# Patient Record
Sex: Female | Born: 1975 | Race: Black or African American | Hispanic: No | Marital: Single | Smoking: Never smoker
Health system: Southern US, Community
[De-identification: ages and names within clinical notes are randomized; demographics above are authoritative.]

## PROBLEM LIST (undated history)

## (undated) DIAGNOSIS — B20 Human immunodeficiency virus [HIV] disease: Secondary | ICD-10-CM

## (undated) DIAGNOSIS — A6000 Herpesviral infection of urogenital system, unspecified: Secondary | ICD-10-CM

## (undated) DIAGNOSIS — T7421XA Adult sexual abuse, confirmed, initial encounter: Secondary | ICD-10-CM

## (undated) DIAGNOSIS — R8789 Other abnormal findings in specimens from female genital organs: Secondary | ICD-10-CM

## (undated) DIAGNOSIS — C469 Kaposi's sarcoma, unspecified: Secondary | ICD-10-CM

## (undated) DIAGNOSIS — R87618 Other abnormal cytological findings on specimens from cervix uteri: Secondary | ICD-10-CM

## (undated) HISTORY — PX: LESION EXCISION: SHX5167

---

## 2004-05-29 HISTORY — PX: CHOLECYSTECTOMY, LAPAROSCOPIC: SHX56

## 2005-01-17 ENCOUNTER — Emergency Department (HOSPITAL_COMMUNITY): Admission: EM | Admit: 2005-01-17 | Discharge: 2005-01-17 | Payer: Self-pay | Admitting: Emergency Medicine

## 2012-08-31 ENCOUNTER — Encounter (HOSPITAL_BASED_OUTPATIENT_CLINIC_OR_DEPARTMENT_OTHER): Payer: Self-pay | Admitting: *Deleted

## 2012-08-31 ENCOUNTER — Emergency Department (HOSPITAL_BASED_OUTPATIENT_CLINIC_OR_DEPARTMENT_OTHER)
Admission: EM | Admit: 2012-08-31 | Discharge: 2012-09-01 | Disposition: A | Discharge status: Home / Self Care | Payer: 59 | Attending: Emergency Medicine | Admitting: Emergency Medicine

## 2012-08-31 DIAGNOSIS — W261XXA Contact with sword or dagger, initial encounter: Secondary | ICD-10-CM | POA: Insufficient documentation

## 2012-08-31 DIAGNOSIS — Y9389 Activity, other specified: Secondary | ICD-10-CM | POA: Insufficient documentation

## 2012-08-31 DIAGNOSIS — Z79899 Other long term (current) drug therapy: Secondary | ICD-10-CM | POA: Insufficient documentation

## 2012-08-31 DIAGNOSIS — S61409A Unspecified open wound of unspecified hand, initial encounter: Secondary | ICD-10-CM | POA: Insufficient documentation

## 2012-08-31 DIAGNOSIS — S61412A Laceration without foreign body of left hand, initial encounter: Secondary | ICD-10-CM

## 2012-08-31 DIAGNOSIS — Y929 Unspecified place or not applicable: Secondary | ICD-10-CM | POA: Insufficient documentation

## 2012-08-31 DIAGNOSIS — W260XXA Contact with knife, initial encounter: Secondary | ICD-10-CM | POA: Insufficient documentation

## 2012-08-31 NOTE — ED Notes (Signed)
Pt states she was trying to open a can and cut herself with a knife. Approx 1 cm lac to left hand. Bleeding controlled.

## 2012-09-01 NOTE — ED Notes (Signed)
MD at bedside. 

## 2012-09-01 NOTE — ED Provider Notes (Signed)
History    This chart was scribed for Gwyneth Sprout, MD scribed by Magnus Sinning. The patient was seen in room MH09/MH09 at 00:07   CSN: [id number]  Arrival date & time 08/31/12  2332     Chief Complaint  Patient presents with  . Laceration    (Consider location/radiation/quality/duration/timing/severity/associated sxs/prior treatment) Patient is a 37 y.o. female presenting with skin laceration. The history is provided by the patient. No language interpreter was used.  Laceration  Brandy Mendez is a 37 y.o. female who presents to the Emergency Department complaining of a 1 cm laceration the dorsum of the left hand near the base of the thumb after accidentally cutting herself with a knife when trying to open a can tonight. She initially reports numbness to the thumb, but on exam reports normal sensation. History reviewed. No pertinent past medical history.  History reviewed. No pertinent past surgical history.  History reviewed. No pertinent family history.  History  Substance Use Topics  . Smoking status: Never Smoker   . Smokeless tobacco: Not on file  . Alcohol Use: No   Review of Systems  Skin:       Laceration to the left hand    Allergies  Penicillins  Home Medications   Current Outpatient Rx  Name  Route  Sig  Dispense  Refill  . Sulfamethoxazole-Trimethoprim (BACTRIM PO)   Oral   Take by mouth.           BP 135/91  Pulse 88  Temp(Src) 99 F (37.2 C) (Oral)  Ht 5\' 5"  (1.651 m)  Wt 179 lb (81.194 kg)  BMI 29.79 kg/m2  SpO2 98%  LMP 08/10/2012  Physical Exam  Nursing note and vitals reviewed. Constitutional: She is oriented to person, place, and time. She appears well-developed and well-nourished. No distress.  HENT:  Head: Normocephalic and atraumatic.  Eyes: EOM are normal. Pupils are equal, round, and reactive to light.  Neck: Neck supple. No tracheal deviation present.  Cardiovascular: Normal rate.   Pulmonary/Chest: Effort normal. No  respiratory distress.  Abdominal: Soft. She exhibits no distension.  Musculoskeletal: Normal range of motion. She exhibits no edema.       Hands: 1 cm laceration to the dorsal part of the hand near the thumb  Neurological: She is alert and oriented to person, place, and time. No sensory deficit.  Nml sensation and function of the left index finger. Can bend at the PIP and DIP joint.  Skin: Skin is warm and dry.  Psychiatric: She has a normal mood and affect. Her behavior is normal.    ED Course  Procedures (including critical care time) DIAGNOSTIC STUDIES: Oxygen Saturation is 98% on room air, normal by my interpretation.    COORDINATION OF CARE: 00:08: Physical exam performed.   LACERATION REPAIR PROCEDURE NOTE The patient's identification was confirmed and consent was obtained. This procedure was performed by Gwyneth Sprout, MD at 12:11 AM. Site: Dorsum of the left hand near thumb  Sterile procedures observed: Yes Anesthetic used (type and amt): 2% Lidocaine w/o epi Suture type/size:4-O prolene suture Length: 1 cm  # of Sutures:Two Technique:Simple Complexity: Interrupted Antibx ointment applied: Bacitracin Tetanus UTD: Last year Site anesthetized, irrigated with NS, explored without evidence of foreign body, wound well approximated, site covered with dry, sterile dressing.  Patient tolerated procedure well without complications. Instructions for care discussed verbally and patient provided with additional written instructions for homecare and f/u.  Labs Reviewed - No data to display No results found.  1. Hand laceration, left, initial encounter       MDM   Patient cut her hand with a knife tonight. She is neurovascularly intact without any sign of tendon injury.  And repaired as above in tetanus shot is up-to-date I personally performed the services described in this documentation, which was scribed in my presence.  The recorded information has been reviewed  and considered.        Gwyneth Sprout, MD 09/01/12 0157

## 2012-09-09 ENCOUNTER — Emergency Department (HOSPITAL_BASED_OUTPATIENT_CLINIC_OR_DEPARTMENT_OTHER)
Admission: EM | Admit: 2012-09-09 | Discharge: 2012-09-09 | Disposition: A | Discharge status: Home / Self Care | Payer: 59 | Attending: Emergency Medicine | Admitting: Emergency Medicine

## 2012-09-09 ENCOUNTER — Encounter (HOSPITAL_BASED_OUTPATIENT_CLINIC_OR_DEPARTMENT_OTHER): Payer: Self-pay | Admitting: *Deleted

## 2012-09-09 DIAGNOSIS — Z4802 Encounter for removal of sutures: Secondary | ICD-10-CM | POA: Insufficient documentation

## 2012-09-09 NOTE — ED Provider Notes (Signed)
History     CSN: [id number]  Arrival date & time 09/09/12  1257   First MD Initiated Contact with Patient 09/09/12 1323      Chief Complaint  Patient presents with  . Suture / Staple Removal    (Consider location/radiation/quality/duration/timing/severity/associated sxs/prior treatment) HPI Comments: 37 year old female presents emergency department for suture removal from her left hand. Patient states sutures were placed 9 days ago. Denies having any problems with the sutures since there were placed. Denies pain, redness, swelling or discharge. No fever or chills.  Patient is a 37 y.o. female presenting with suture removal. The history is provided by the patient.  Suture / Staple Removal     History reviewed. No pertinent past medical history.  History reviewed. No pertinent past surgical history.  No family history on file.  History  Substance Use Topics  . Smoking status: Never Smoker   . Smokeless tobacco: Not on file  . Alcohol Use: No    OB History   Grav Para Term Preterm Abortions TAB SAB Ect Mult Living                  Review of Systems  Skin: Negative for color change.  All other systems reviewed and are negative.    Allergies  Penicillins  Home Medications   Current Outpatient Rx  Name  Route  Sig  Dispense  Refill  . Sulfamethoxazole-Trimethoprim (BACTRIM PO)   Oral   Take by mouth.           BP 120/79  Pulse 87  Temp(Src) 98.1 F (36.7 C) (Oral)  Resp 20  Wt 179 lb (81.194 kg)  BMI 29.79 kg/m2  SpO2 100%  LMP 08/10/2012  Physical Exam  Nursing note and vitals reviewed. Constitutional: She is oriented to person, place, and time. She appears well-developed and well-nourished. No distress.  HENT:  Head: Normocephalic and atraumatic.  Mouth/Throat: Oropharynx is clear and moist.  Eyes: Conjunctivae are normal.  Neck: Normal range of motion.  Cardiovascular: Normal rate, regular rhythm and normal heart sounds.   Pulmonary/Chest:  Effort normal and breath sounds normal.  Musculoskeletal: Normal range of motion. She exhibits no edema.  Neurological: She is alert and oriented to person, place, and time.  Skin: Skin is warm and dry.  Well-healed 1 cm laceration to the dorsal aspect of her left hand near her thumb. No surrounding erythema, edema or discharge. 2 Sutures in place.  Psychiatric: She has a normal mood and affect. Her behavior is normal.    ED Course  Procedures (including critical care time) SUTURE REMOVAL Performed by: Johnnette Gourd  Consent: Verbal consent obtained. Patient identity confirmed: provided demographic data Time out: Immediately prior to procedure a "time out" was called to verify the correct patient, procedure, equipment, support staff and site/side marked as required.  Location details: left hand  Wound Appearance: clean  Sutures/Staples Removed: 2  Facility: sutures placed in this facility Patient tolerance: Patient tolerated the procedure well with no immediate complications.    Labs Reviewed - No data to display No results found.   1. Visit for suture removal       MDM  2 sutures removed without difficulty. Wound healed without any evidence of secondary infection.       Trevor Mace, PA-C 09/09/12 [phone]

## 2012-09-09 NOTE — ED Notes (Signed)
Suture removal. Sutures x 2 left hand.

## 2012-09-09 NOTE — ED Notes (Signed)
Pt. Has band aid on suture removal area on L hand.  Pt. Reports no pain.

## 2012-09-11 NOTE — ED Provider Notes (Signed)
Medical screening examination/treatment/procedure(s) were performed by non-physician practitioner and as supervising physician I was immediately available for consultation/collaboration.  Kanye Depree T Perle Brickhouse, MD 09/11/12 0828 

## 2013-01-27 HISTORY — PX: LACERATION REPAIR: SHX5284

## 2014-04-20 ENCOUNTER — Encounter (HOSPITAL_COMMUNITY): Payer: Self-pay | Admitting: Emergency Medicine

## 2014-04-20 ENCOUNTER — Encounter (HOSPITAL_COMMUNITY)
Admission: EM | Disposition: A | Discharge status: Home / Self Care | Payer: Self-pay | Source: Home / Self Care | Attending: Internal Medicine

## 2014-04-20 ENCOUNTER — Emergency Department (HOSPITAL_COMMUNITY): Payer: 59

## 2014-04-20 ENCOUNTER — Inpatient Hospital Stay (HOSPITAL_COMMUNITY)
Admission: EM | Admit: 2014-04-20 | Discharge: 2014-04-23 | DRG: 377 | Disposition: A | Discharge status: Home / Self Care | Payer: 59 | Attending: Internal Medicine | Admitting: Internal Medicine

## 2014-04-20 ENCOUNTER — Inpatient Hospital Stay (HOSPITAL_COMMUNITY): Payer: 59

## 2014-04-20 DIAGNOSIS — Z79899 Other long term (current) drug therapy: Secondary | ICD-10-CM

## 2014-04-20 DIAGNOSIS — B20 Human immunodeficiency virus [HIV] disease: Secondary | ICD-10-CM | POA: Diagnosis present

## 2014-04-20 DIAGNOSIS — K920 Hematemesis: Secondary | ICD-10-CM | POA: Diagnosis present

## 2014-04-20 DIAGNOSIS — D62 Acute posthemorrhagic anemia: Secondary | ICD-10-CM | POA: Diagnosis present

## 2014-04-20 DIAGNOSIS — I951 Orthostatic hypotension: Secondary | ICD-10-CM | POA: Diagnosis present

## 2014-04-20 DIAGNOSIS — C469 Kaposi's sarcoma, unspecified: Secondary | ICD-10-CM | POA: Diagnosis present

## 2014-04-20 DIAGNOSIS — R42 Dizziness and giddiness: Secondary | ICD-10-CM | POA: Diagnosis present

## 2014-04-20 DIAGNOSIS — E876 Hypokalemia: Secondary | ICD-10-CM | POA: Diagnosis present

## 2014-04-20 DIAGNOSIS — K922 Gastrointestinal hemorrhage, unspecified: Secondary | ICD-10-CM | POA: Diagnosis present

## 2014-04-20 HISTORY — PX: ESOPHAGOGASTRODUODENOSCOPY: SHX5428

## 2014-04-20 HISTORY — DX: Herpesviral infection of urogenital system, unspecified: A60.00

## 2014-04-20 HISTORY — DX: Human immunodeficiency virus (HIV) disease: B20

## 2014-04-20 HISTORY — DX: Kaposi's sarcoma, unspecified: C46.9

## 2014-04-20 HISTORY — DX: Other abnormal findings in specimens from female genital organs: R87.89

## 2014-04-20 HISTORY — DX: Other abnormal cytological findings on specimens from cervix uteri: R87.618

## 2014-04-20 HISTORY — DX: Adult sexual abuse, confirmed, initial encounter: T74.21XA

## 2014-04-20 LAB — COMPREHENSIVE METABOLIC PANEL
ALBUMIN: 2.7 g/dL — AB (ref 3.5–5.2)
ALT: 17 U/L (ref 0–35)
AST: 14 U/L (ref 0–37)
Alkaline Phosphatase: 64 U/L (ref 39–117)
Anion gap: 8 (ref 5–15)
BILIRUBIN TOTAL: 0.3 mg/dL (ref 0.3–1.2)
BUN: 34 mg/dL — ABNORMAL HIGH (ref 6–23)
CHLORIDE: 107 meq/L (ref 96–112)
CO2: 23 mEq/L (ref 19–32)
CREATININE: 0.63 mg/dL (ref 0.50–1.10)
Calcium: 7.8 mg/dL — ABNORMAL LOW (ref 8.4–10.5)
GFR calc Af Amer: >90 mL/min (ref >90)
GFR calc non Af Amer: >90 mL/min (ref >90)
Glucose, Bld: 113 mg/dL — ABNORMAL HIGH (ref 70–99)
Potassium: 4.4 mEq/L (ref 3.7–5.3)
SODIUM: 138 meq/L (ref 137–147)
Total Protein: 5.4 g/dL — ABNORMAL LOW (ref 6.0–8.3)

## 2014-04-20 LAB — CBC WITH DIFFERENTIAL/PLATELET
Basophils Absolute: 0 10*3/uL (ref 0.0–0.1)
Basophils Relative: 1 % (ref 0–1)
Eosinophils Absolute: 0.3 10*3/uL (ref 0.0–0.7)
Eosinophils Relative: 4 % (ref 0–5)
HCT: 25.8 % — ABNORMAL LOW (ref 36.0–46.0)
HEMOGLOBIN: 8.3 g/dL — AB (ref 12.0–15.0)
LYMPHS ABS: 1.2 10*3/uL (ref 0.7–4.0)
Lymphocytes Relative: 14 % (ref 12–46)
MCH: 26.4 pg (ref 26.0–34.0)
MCHC: 32.2 g/dL (ref 30.0–36.0)
MCV: 82.2 fL (ref 78.0–100.0)
MONOS PCT: 9 % (ref 3–12)
Monocytes Absolute: 0.8 10*3/uL (ref 0.1–1.0)
NEUTROS ABS: 6.3 10*3/uL (ref 1.7–7.7)
NEUTROS PCT: 72 % (ref 43–77)
Platelets: 220 10*3/uL (ref 150–400)
RBC: 3.14 MIL/uL — AB (ref 3.87–5.11)
RDW: 12.6 % (ref 11.5–15.5)
WBC: 8.7 10*3/uL (ref 4.0–10.5)

## 2014-04-20 LAB — POC URINE PREG, ED: PREG TEST UR: NEGATIVE

## 2014-04-20 LAB — PROTIME-INR
INR: 1.19 (ref 0.00–1.49)
PROTHROMBIN TIME: 15.3 s — AB (ref 11.6–15.2)

## 2014-04-20 LAB — ABO/RH: ABO/RH(D): O POS

## 2014-04-20 LAB — PREPARE RBC (CROSSMATCH)

## 2014-04-20 LAB — POC OCCULT BLOOD, ED: Fecal Occult Bld: NEGATIVE

## 2014-04-20 SURGERY — EGD (ESOPHAGOGASTRODUODENOSCOPY)
Anesthesia: Moderate Sedation

## 2014-04-20 MED ORDER — MIDAZOLAM HCL 5 MG/5ML IJ SOLN
INTRAMUSCULAR | Status: DC | PRN
  Administered 2014-04-20 (×2): 2 mg via INTRAVENOUS
  Administered 2014-04-20: 1 mg via INTRAVENOUS

## 2014-04-20 MED ORDER — ONDANSETRON HCL 4 MG/2ML IJ SOLN
4.0000 mg | Freq: Once | INTRAMUSCULAR | Status: AC
  Administered 2014-04-20: 4 mg via INTRAVENOUS
  Filled 2014-04-20: qty 2

## 2014-04-20 MED ORDER — SODIUM CHLORIDE 0.9 % IV SOLN
8.0000 mg/h | INTRAVENOUS | Status: DC
  Administered 2014-04-20: 8 mg/h via INTRAVENOUS
  Filled 2014-04-20 (×4): qty 80

## 2014-04-20 MED ORDER — FENTANYL CITRATE 0.05 MG/ML IJ SOLN
INTRAMUSCULAR | Status: DC | PRN
  Administered 2014-04-20 (×3): 25 ug via INTRAVENOUS

## 2014-04-20 MED ORDER — IOHEXOL 300 MG/ML  SOLN
100.0000 mL | Freq: Once | INTRAMUSCULAR | Status: AC | PRN
  Administered 2014-04-20: 100 mL via INTRAVENOUS

## 2014-04-20 MED ORDER — ONDANSETRON HCL 4 MG/2ML IJ SOLN: 4.0000 mg | Freq: Four times a day (QID) | INTRAMUSCULAR | Status: DC | PRN

## 2014-04-20 MED ORDER — SODIUM CHLORIDE 0.9 % IV BOLUS (SEPSIS)
1000.0000 mL | Freq: Once | INTRAVENOUS | Status: AC
  Administered 2014-04-20: 1000 mL via INTRAVENOUS

## 2014-04-20 MED ORDER — PANTOPRAZOLE SODIUM 40 MG IV SOLR: 40.0000 mg | Freq: Two times a day (BID) | INTRAVENOUS | Status: DC

## 2014-04-20 MED ORDER — SODIUM CHLORIDE 0.9 % IV SOLN: Freq: Once | INTRAVENOUS | Status: DC

## 2014-04-20 MED ORDER — FENTANYL CITRATE 0.05 MG/ML IJ SOLN
INTRAMUSCULAR | Status: AC
  Filled 2014-04-20: qty 2

## 2014-04-20 MED ORDER — DIPHENHYDRAMINE HCL 50 MG/ML IJ SOLN
INTRAMUSCULAR | Status: AC
  Filled 2014-04-20: qty 1

## 2014-04-20 MED ORDER — ACETAMINOPHEN 650 MG RE SUPP: 650.0000 mg | Freq: Four times a day (QID) | RECTAL | Status: DC | PRN

## 2014-04-20 MED ORDER — MIDAZOLAM HCL 5 MG/ML IJ SOLN
INTRAMUSCULAR | Status: AC
  Filled 2014-04-20: qty 2

## 2014-04-20 MED ORDER — PANTOPRAZOLE SODIUM 40 MG IV SOLR
40.0000 mg | Freq: Once | INTRAVENOUS | Status: AC
  Administered 2014-04-20: 40 mg via INTRAVENOUS
  Filled 2014-04-20: qty 40

## 2014-04-20 MED ORDER — ACETAMINOPHEN 325 MG PO TABS
650.0000 mg | ORAL_TABLET | Freq: Four times a day (QID) | ORAL | Status: DC | PRN
  Administered 2014-04-20 – 2014-04-21 (×2): 650 mg via ORAL
  Filled 2014-04-20 (×2): qty 2

## 2014-04-20 MED ORDER — SODIUM CHLORIDE 0.9 % IV BOLUS (SEPSIS): 1000.0000 mL | Freq: Once | INTRAVENOUS | Status: DC

## 2014-04-20 MED ORDER — SODIUM CHLORIDE 0.9 % IV SOLN: INTRAVENOUS | Status: DC

## 2014-04-20 MED ORDER — SODIUM CHLORIDE 0.9 % IV SOLN
80.0000 mg | Freq: Once | INTRAVENOUS | Status: AC
  Administered 2014-04-20: 80 mg via INTRAVENOUS
  Filled 2014-04-20: qty 80

## 2014-04-20 NOTE — Plan of Care (Signed)
Problem: Consults Goal: Skin Care Protocol Initiated - if Braden Score 18 or less If consults are not indicated, leave blank or document N/A  Outcome: Not Applicable Date Met:  64/35/39 Goal: Diabetes Guidelines if Diabetic/Glucose > 140 If diabetic or lab glucose is > 140 mg/dl - Initiate Diabetes/Hyperglycemia Guidelines & Document Interventions  Outcome: Not Applicable Date Met:  05/22/82  Problem: Phase I Progression Outcomes Goal: OOB as tolerated unless otherwise ordered Outcome: Completed/Met Date Met:  04/20/14 Goal: Voiding-avoid urinary catheter unless indicated Outcome: Completed/Met Date Met:  04/20/14

## 2014-04-20 NOTE — Progress Notes (Signed)
Upper endoscopy was entirely normal.  This raises the question of a more distal GI bleeding source such as the small bowel where she could have a Kaposi's lesion.  Mallory-Weiss tear is also a consideration.  Recommendations #1 CT of the abdomen and pelvis #2 pending results of above would consider repeat endoscopy and possibly small bowel enteroscopy for a current overt GI bleeding

## 2014-04-20 NOTE — ED Notes (Signed)
Pt DOES NOT WANT DAUGHTERS TO KNOW ANY OF HER MEDICAL HX-- DO NOT DISCUSS ANY HX IN FRONT OF FAMILY

## 2014-04-20 NOTE — ED Notes (Addendum)
To ED via [facility] from home with c/o vomiting blood, when EMS arrived on scene-- pt was found in a large puddle of dark red blood with clots, pt states had a headache last night, with some vomiting and heart racing . Took Aleve. This morning had an episode of vomiting, dizziness, heart racing, faint feeling when standing up. Pt had a syncopal episode when EMS attempted to sit her up. BP initially was 60/p -- came up to 108/82.

## 2014-04-20 NOTE — H&P (Addendum)
[facility] History and Physical  Brandy Mendez ZDG:[id number] DOB: 02/01/1976 DOA: 04/20/2014  Referring physician: EDP PCP: No primary care provider on file.   Chief Complaint: Vomiting blood  HPI: Brandy Mendez is a 38 y.o. 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 This morning felt lightheaded and dizzy and then vomited bright red blood with clots 3 times and was brought to the ER by EMS. She denies ETOH, chronic NSAID use   Review of Systems: positives bolded Constitutional:  No weight loss, night sweats, Fevers, chills, fatigue.  HEENT:  No headaches, Difficulty swallowing,Tooth/dental problems,Sore throat,  No sneezing, itching, ear ache, nasal congestion, post nasal drip,  Cardio-vascular:  No chest pain, Orthopnea, PND, swelling in lower extremities, anasarca, dizziness, palpitations  GI:  No heartburn, indigestion, abdominal pain, nausea, vomiting, diarrhea, change in bowel habits, loss of appetite  Resp:  No shortness of breath with exertion or at rest. No excess mucus, no productive cough, No non-productive cough, No coughing up of blood.No change in color of mucus.No wheezing.No chest wall deformity  Skin:  no rash or lesions.  GU:  no dysuria, change in color of urine, no urgency or frequency. No flank pain.  Musculoskeletal:  No joint pain or swelling. No decreased range of motion. No back pain.  Psych:  No change in mood or affect. No depression or anxiety. No memory loss.   History reviewed. No pertinent past medical history. Past Surgical History  Procedure Laterality Date  . Laceration repair Left 01/2013    Hand  . Cholecystectomy, laparoscopic  2006  . Excision benign growth Right     Overlying the  scapular region   Social History:  reports that she has never smoked. She does not have any smokeless tobacco history on file. She reports that she does not drink alcohol or use illicit drugs.  Allergies  Allergen Reactions  . Penicillins Rash    No family history on file.   Prior to Admission medications   Not on File   Physical Exam: Filed Vitals:   04/20/14 1300 04/20/14 1315 04/20/14 1330 04/20/14 1345  BP: 111/59 113/58 119/66 127/66  Pulse: 93 101 105 109  Temp:      TempSrc:      Resp: 13 11 12 15   Height:      Weight:      SpO2: 100% 100% 100% 100%    Wt Readings from Last 3 Encounters:  04/20/14 86.183 kg (190 lb)  09/09/12 81.194 kg (179 lb)  08/31/12 81.194 kg (179 lb)    General:  Appears ill, AAOx3 Eyes: PERRL, normal lids, irises & conjunctiva ENT: grossly normal lips & tongue Neck: no LAD, masses or thyromegaly Cardiovascular: RRR, no m/r/g. No LE edema. Respiratory: CTA bilaterally, no w/r/r. Normal respiratory effort. Abdomen: soft, NT, ND, BS present Skin: no rash or induration seen on limited exam Musculoskeletal: grossly normal tone BUE/BLE Psychiatric: grossly normal mood and affect, speech fluent and appropriate Neurologic: grossly non-focal.          Labs on Admission:  Basic Metabolic Panel:  Recent Labs Lab 04/20/14 1109  NA 138  K 4.4  CL 107  CO2 23  GLUCOSE 113*  BUN 34*  CREATININE 0.63  CALCIUM  7.8*   Liver Function Tests:  Recent Labs Lab 04/20/14 1109  AST 14  ALT 17  ALKPHOS 64  BILITOT 0.3  PROT 5.4*  ALBUMIN 2.7*   No results for input(s): LIPASE, AMYLASE in the last 168 hours. No results for input(s): AMMONIA in the last 168 hours. CBC:  Recent Labs Lab 04/20/14 1109  WBC 8.7  NEUTROABS 6.3  HGB 8.3*  HCT 25.8*  MCV 82.2  PLT 220   Cardiac Enzymes: No results for input(s): CKTOTAL, CKMB, CKMBINDEX, TROPONINI in the last 168 hours.  BNP (last 3 results) No results for input(s): PROBNP in the  last 8760 hours. CBG: No results for input(s): GLUCAP in the last 168 hours.  Radiological Exams on Admission: Dg Chest Port 1 View  04/20/2014   CLINICAL DATA:  Hematemesis, syncope, headache last night, dizziness, tachycardia, hypotensive  EXAM: PORTABLE CHEST - 1 VIEW  COMPARISON:  Portable exam 1123 hr compared to 08/12/2013  FINDINGS: Normal heart size, mediastinal contours, and pulmonary vascularity.  Lungs clear.  No pleural effusion or pneumothorax.  Bones unremarkable.  IMPRESSION: No acute abnormalities.   Electronically Signed   By: Lavonia Dana M.D.   On: 04/20/2014 11:54    Assessment/Plan   1. Upper GI bleed -with orthostatic hypotension -admit to SDU -transfuse 2 units PRBC -Protonix infusion -CBC Q8 -IVF -[facility] consulted, plan for EGD today  2.  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 -I had asked patient about HIV 3 times during my initial patient interview after asking her daughter to step out of room, at every point she blatantly refused this. -I have not confronted her about this after confirming/reviewing records from [facility]. -Utmost care will need to be taken to not speak to her regarding this in presence of family, as it appears that they do not know regarding 042.  Code Status: Full Code DVT Prophylaxis: SCDs Family Communication: daughter at bedside Disposition Plan: SDU  Time spent: 41min  Kieren Ricci [facility] Pager [phone]

## 2014-04-20 NOTE — Op Note (Signed)
[facility] [place], [id number]   ENDOSCOPY PROCEDURE REPORT  PATIENT: Brandy, Mendez  MR#: [id number] BIRTHDATE: 1975-12-06 , 38  yrs. old GENDER: female ENDOSCOPIST: Inda Castle, MD REFERRED BY: PROCEDURE DATE:  05-11-14 PROCEDURE:  EGD, diagnostic ASA CLASS:     Class II INDICATIONS:  hematemesis. MEDICATIONS: Fentanyl 75 mcg IV and Versed 5 mg IV TOPICAL ANESTHETIC: Cetacaine Spray  DESCRIPTION OF PROCEDURE: After the risks benefits and alternatives of the procedure were thoroughly explained, informed consent was obtained.  The Pentax Gastroscope [id number] endoscope was introduced through the mouth and advanced to the second portion of the duodenum , Without limitations.  The instrument was slowly withdrawn as the mucosa was fully examined.      EXAM: The esophagus and gastroesophageal junction were completely normal in appearance.  The stomach was entered and closely examined.The antrum, angularis, and lesser curvature were well visualized, including a retroflexed view of the cardia and fundus. The stomach wall was normally distensable.  The scope passed easily through the pylorus into the duodenum.  There was no fresh or old blood.    retroflexion did not demonstrate any abnormalities. The scope was then withdrawn from the patient and the procedure completed.  COMPLICATIONS: There were no immediate complications.  ENDOSCOPIC IMPRESSION: Normal appearing esophagus and GE junction, the stomach was well visualized and normal in appearance, normal appearing duodenum  RECOMMENDATIONS: Continue PPI repeat endoscopy for overt bleeding; if negative patient will need a capsule study CT of the abdomen to evaluate for small bowel neoplastic disease  REPEAT EXAM:  eSigned:  Inda Castle, MD May 11, 2014 4:07 PM    CC:  CPT CODES: ICD CODES:  The ICD and CPT codes recommended by this software are interpretations from the  data that the clinical staff has captured with the software.  The verification of the translation of this report to the ICD and CPT codes and modifiers is the sole responsibility of the health care institution and practicing physician where this report was generated.  [employer] will not be held responsible for the validity of the ICD and CPT codes included on this report.  AMA assumes no liability for data contained or not contained herein. CPT is a Designer, television/film set of the [employer].  PATIENT NAME:  Brandy, Mendez MR#: [id number]

## 2014-04-20 NOTE — ED Notes (Signed)
TO ENDO via ENDO staff---

## 2014-04-20 NOTE — Consult Note (Signed)
[facility] Consult: 1:53 PM 04/20/2014  LOS: 0 days    Referring Provider: Dr. Regenia Skeeter in the emergency room.  Primary Care Physician:  Josephina Gip, nurse practitioner with infectious disease clinic at [facility] Primary Gastroenterologist:  unassigned     Reason for Consultation:  Vomiting blood.   HPI: Brandy Mendez is a 38 y.o. female. She is originally from the [place] but has lived in the [place] for 9 years. She speaks [place] fairly well but is not literate to read [place]. She is status post cholecystectomy at age 74. She is HIV positive and on multiple antiretroviral therapy supervised by physicians at [facility].  She has a diagnosis of Kaposi's sarcoma. Also has had abnormal Pap smear and cervical dysplasia. She is para 2, both vaginal deliveries.  Yesterday at about 6 PM patient had which she calls a headache. She took 2 Aleve for this and drank a lot of water. She ate some chicken at about 845 and felt okay. At 11:30, while she was on the late shift at work, she got dizzy, diaphoretic and tachycardic. She ended up laying down which would relieve her symptoms. Ultimately her daughter picked her up and brought her home. She continued to be dizzy and lightheaded. This morning she started vomiting blood and had 3 episodes of frank hematemesis of what sounds like large volume.  In the ED she was orthostatic and is being bolused with IV fluid for this.  Initial blood pressures reached 72/59 with pulse of 101.  Hemoglobin is 8.3 with normal MCV. On 04/15/2014 the hemoglobin was 13. Her BUN is elevated at 34 with normal creatinine.  PT and INR are within normal limits.  FOBT testing on stool is negative. She has not had abdominal pain. Her stool yesterday was brown and she hasn't had any since. For  several weeks she describes early satiety. She occasionally will get heartburn but this is a very infrequent occurrence maybe a couple of times a year. Even though she's not been eating much she has managed to gain about 8 pounds since August. She doesn't describe abdominal distention nor extremity edema. Patient does not describe unusual bleeding or bruising tendencies other than today's hematemesis. Does admit to using up to 2 full strength aspirin daily when she isn't feeling well or has aches and pains. She takes the aspirin about 5 days out of the week. Patient has never been told she has any problems with her liver or stomach. She has never undergone colonoscopy or upper endoscopy.   History reviewed. No pertinent past medical history.  Past Surgical History  Procedure Laterality Date  . Laceration repair Left 01/2013    Hand  . Cholecystectomy, laparoscopic  2006  . Excision benign growth Right     Overlying the scapular region    Prior to Admission medications   Not on File  650 mg aspirin once daily, when necessary. Takes this about 5 days out of the week. Aleve. 2 by mouth once yesterday 11/22  Scheduled Meds:  Infusions:  PRN Meds:  Allergies as of 04/20/2014 - Review Complete 04/20/2014  Allergen Reaction Noted  . Penicillins Rash 08/31/2012    Family history Patient denies history of ulcer disease, intestinal or stomach cancers and liver disease.  History   Social History  . Marital Status: Single    Spouse Name: N/A    Number of Children: N/A  . Years of Education: N/A   Occupational History  . Not on file.   Social History Main Topics  . Smoking status: Never Smoker   . Smokeless tobacco: Not on file  . Alcohol Use: No  . Drug Use: No  . Sexual Activity: Not on file   Other Topics Concern  . Not on file   Social History Narrative    REVIEW OF SYSTEMS: Constitutional:  Per history of present illness. Until yesterday the patient was feeling  pretty well. ENT:  No nose bleeds Pulm:  No cough, no dyspnea. CV:  No palpitations, no LE edema.  GU:  No hematuria, no frequency.  No blood in the urine GI:  No dysphagia, no bloody bowel movements or melena. Heme:  Never advise she had anemia in past but she doesn't go to the doctor a lot.   Transfusions:  Never Neuro:  No headaches, no peripheral tingling or numbness Derm:  No itching, no rash or sores.  Endocrine: Positive for sweats and chills.  No polyuria or dysuria Immunization:  Did not inquire as to immunizations Travel:  None beyond local counties in last few months.    PHYSICAL EXAM: Vital signs in last 24 hours: Filed Vitals:   04/20/14 1315  BP: 113/58  Pulse: 101  Temp:   Resp: 11   Wt Readings from Last 3 Encounters:  04/20/14 190 lb (86.183 kg)  09/09/12 179 lb (81.194 kg)  08/31/12 179 lb (81.194 kg)    General: Pleasant, well-appearing black female who is comfortable and in no distress. Head:  No asymmetry or swelling.  Eyes:  Conjunctiva is pale, no scleral icterus. Ears:  Not hard of hearing  Nose:  No congestion or discharge, no bleeding Mouth:  Clear, moist dentition in good repair. Neck:  No JVD, no bruits, no thyromegaly or masses. Lungs:  Clear to auscultation and percussion bilaterally. No cough or dyspnea Heart: RRR. No MRG. S1/S2 audible Abdomen:  Soft, nontender, nondistended. No HSM, no mass. Bowel sounds active.   Rectal: Deferred.  FOBT testing per emergency room physician was negative this morning. Musc/Skeltl: No joint swelling, contractures or other deformities. Limb strength intact. Extremities:  No cyanosis clubbing or edema.  Neurologic:  Oriented 3. No tremor, no gross deficits. Skin:  No sores or hyperpigmented lesions Tattoos:  None seen Nodes:  No cervical adenopathy   Psych:  Cooperative, pleasant, relaxed  Intake/Output from previous day:   Intake/Output this shift:    LAB RESULTS:  Recent Labs  04/20/14 1109    WBC 8.7  HGB 8.3*  HCT 25.8*  PLT 220   BMET Lab Results  Component Value Date   NA 138 04/20/2014   K 4.4 04/20/2014   CL 107 04/20/2014   CO2 23 04/20/2014   GLUCOSE 113* 04/20/2014   BUN 34* 04/20/2014   CREATININE 0.63 04/20/2014   CALCIUM 7.8* 04/20/2014   LFT  Recent Labs  04/20/14 1109  PROT 5.4*  ALBUMIN 2.7*  AST 14  ALT 17  ALKPHOS 64  BILITOT 0.3   PT/INR Lab Results  Component Value Date   INR 1.19 04/20/2014  Hepatitis Panel No results for input(s): HEPBSAG, HCVAB, HEPAIGM, HEPBIGM in the last 72 hours. C-Diff No components found for: CDIFF Lipase  No results found for: LIPASE  Drugs of Abuse  No results found for: LABOPIA, COCAINSCRNUR, LABBENZ, AMPHETMU, THCU, LABBARB   RADIOLOGY STUDIES: Dg Chest Port 1 View  04/20/2014   CLINICAL DATA:  Hematemesis, syncope, headache last night, dizziness, tachycardia, hypotensive  EXAM: PORTABLE CHEST - 1 VIEW  COMPARISON:  Portable exam 1123 hr compared to 08/12/2013  FINDINGS: Normal heart size, mediastinal contours, and pulmonary vascularity.  Lungs clear.  No pleural effusion or pneumothorax.  Bones unremarkable.  IMPRESSION: No acute abnormalities.   Electronically Signed   By: Lavonia Dana M.D.   On: 04/20/2014 11:54    ENDOSCOPIC STUDIES: None ever  IMPRESSION:   *  Acute hematemesis of large volumes.  Rule out ulcer disease, rule out Mallory-Weiss tear, rule out neoplasia. LFTs are normal, patient is not a drinker or so likelihood of this being a liver related portal hypertensive/variceal source is less likely.  She describes early satiety associated with weight gain over last few months  *  Near syncope due to GI bleed.  Orthostatic in the emergency room.  *  Normocytic anemia. Suspect acute blood loss anemia is the cause. Hemoglobin on 04/15/2014 was 13.  *  Azotemia, suspect this is from the upper GI bleed.  *  HIV infection. Latest ultra Quant cell count less than 20.  On multiple  anti-retroviral meds. Current regimen ofdolutegravir, emtricitabine, tenofovir (as Truvada) in early June 2015.    *  Abnormal pap smears and HPV infection.  Pap in 2014 with Low grade squamous intraepithelial lesion, rare cells suspicious for a high-grade squamous intraepithelial lesion. Status post cervical biopsy 10/2013 showing high-grade squamous intraepithelial lesion     PLAN:     *  Patient needs an EGD.   May be able to arrange this for this afternoon.  *  Blood transfusion of 2 units of blood has been ordered. The first has yet to be started. Can recheck blood counts following completion of transfusions.  *  Patient has received a single 40 mg dose of IV Protonix. Will need either the Protonix drip or twice daily Protonix.  Azucena Freed  04/20/2014, 1:53 PM Pager: [phone]  GI Attending Note   Chart was reviewed and patient was examined. X-rays and lab were reviewed.    I agree with management and plans.  Kaposi's sarcoma can also metastasize to the GI tract and is a potential source for bleeding.  Plan to proceed with upper endoscopy today.  Sandy Salaam. Deatra Ina, M.D., [facility] Cell [phone]

## 2014-04-20 NOTE — ED Provider Notes (Signed)
CSN: [id number]     Arrival date & time 04/20/14  1037 History   First MD Initiated Contact with Patient 04/20/14 1042     Chief Complaint  Patient presents with  . GI Bleeding     (Consider location/radiation/quality/duration/timing/severity/associated sxs/prior Treatment) HPI  38 year old female presents with vomiting blood since this morning. The patient states that last night she developed a mild-to-moderate headache and took Aleve. The headache was a gradual onset since she states that it does not bother too much. She felt dizzy every time she stood up afterwards. The headache seemed to improve after the Aleve. The patient noticed she started having vomiting this morning. According to EMS and the patient there were large blood clots and a large amount of dark blood on the ground. No blood in her stools. No diarrhea or constipation. The patient states she still has a moderate headache. She's not on any blood thinners. The patient feels lightheaded and dizzy only when sitting or standing. Denies chest pain or shortness of breath. Has never had problems with her stomach before. Does not take NSAIDs regularly.  History reviewed. No pertinent past medical history. History reviewed. No pertinent past surgical history. No family history on file. History  Substance Use Topics  . Smoking status: Never Smoker   . Smokeless tobacco: Not on file  . Alcohol Use: No   OB History    No data available     Review of Systems  Constitutional: Negative for fever.  Respiratory: Negative for shortness of breath.   Cardiovascular: Negative for chest pain.  Gastrointestinal: Positive for nausea and vomiting. Negative for abdominal pain, diarrhea, constipation and blood in stool.  Neurological: Positive for dizziness, light-headedness and headaches. Negative for syncope, weakness and numbness.  All other systems reviewed and are negative.     Allergies  Penicillins  Home Medications   Prior to  Admission medications   Not on File   BP 106/61 mmHg  Pulse 99  Temp(Src) 97.8 F (36.6 C) (Oral)  Resp 17  Ht 5\' 6"  (1.676 m)  Wt 190 lb (86.183 kg)  BMI 30.68 kg/m2  SpO2 100%  LMP 04/19/2014 Physical Exam  Constitutional: She is oriented to person, place, and time. She appears well-developed and well-nourished.  HENT:  Head: Normocephalic and atraumatic.  Right Ear: External ear normal.  Left Ear: External ear normal.  Nose: Nose normal.  Eyes: Right eye exhibits no discharge. Left eye exhibits no discharge.  Cardiovascular: Regular rhythm and normal heart sounds.  Tachycardia present.   Pulmonary/Chest: Effort normal and breath sounds normal.  Abdominal: Soft. She exhibits no distension. There is no tenderness.  Neurological: She is alert and oriented to person, place, and time.  CN 2-12 grossly intact. 5/5 strength in all 4 extremities  Skin: Skin is warm and dry.  Nursing note and vitals reviewed.   ED Course  Procedures (including critical care time) Labs Review Labs Reviewed  COMPREHENSIVE METABOLIC PANEL - Abnormal; Notable for the following:    Glucose, Bld 113 (*)    BUN 34 (*)    Calcium 7.8 (*)    Total Protein 5.4 (*)    Albumin 2.7 (*)    All other components within normal limits  PROTIME-INR - Abnormal; Notable for the following:    Prothrombin Time 15.3 (*)    All other components within normal limits  CBC WITH DIFFERENTIAL - Abnormal; Notable for the following:    RBC 3.14 (*)    Hemoglobin 8.3 (*)  HCT 25.8 (*)    All other components within normal limits  POC OCCULT BLOOD, ED  POC URINE PREG, ED  TYPE AND SCREEN  ABO/RH  PREPARE RBC (CROSSMATCH)    Imaging Review Dg Chest Port 1 View  04/20/2014   CLINICAL DATA:  Hematemesis, syncope, headache last night, dizziness, tachycardia, hypotensive  EXAM: PORTABLE CHEST - 1 VIEW  COMPARISON:  Portable exam 1123 hr compared to 08/12/2013  FINDINGS: Normal heart size, mediastinal contours, and  pulmonary vascularity.  Lungs clear.  No pleural effusion or pneumothorax.  Bones unremarkable.  IMPRESSION: No acute abnormalities.   Electronically Signed   By: Lavonia Dana M.D.   On: 04/20/2014 11:54     EKG Interpretation   Date/Time:  Monday April 20 2014 10:46:36 EST Ventricular Rate:  95 PR Interval:  121 QRS Duration: 69 QT Interval:  354 QTC Calculation: 445 R Axis:   50 Text Interpretation:  Sinus rhythm Abnormal R-wave progression, early  transition Nonspecific T abnormalities, anterior leads No old tracing to  compare Confirmed by [place] ([id number]) on 04/20/2014 11:21:07 AM      MDM   Final diagnoses:  Upper GI bleed    Patient's vital signs are currently stable. She had one episode of hypotension while doing orthostatics, return to normal when laying flat. Given IV fluids she appears dehydrated. No evidence of bleeding while in the ER. Rectal exam shows no gross blood. Given her concerning story with multiple episodes of large-volume hematemesis, will consult GI and admit her to medicine in the step down unit.    Ephraim Hamburger, MD 04/20/14 [phone]

## 2014-04-21 ENCOUNTER — Encounter (HOSPITAL_COMMUNITY): Payer: Self-pay | Admitting: Gastroenterology

## 2014-04-21 DIAGNOSIS — B20 Human immunodeficiency virus [HIV] disease: Secondary | ICD-10-CM | POA: Diagnosis present

## 2014-04-21 DIAGNOSIS — K922 Gastrointestinal hemorrhage, unspecified: Secondary | ICD-10-CM | POA: Insufficient documentation

## 2014-04-21 DIAGNOSIS — E876 Hypokalemia: Secondary | ICD-10-CM | POA: Diagnosis present

## 2014-04-21 LAB — COMPREHENSIVE METABOLIC PANEL
ALT: 14 U/L (ref 0–35)
AST: 17 U/L (ref 0–37)
Albumin: 2.5 g/dL — ABNORMAL LOW (ref 3.5–5.2)
Alkaline Phosphatase: 53 U/L (ref 39–117)
Anion gap: 11 (ref 5–15)
BUN: 14 mg/dL (ref 6–23)
CALCIUM: 7.9 mg/dL — AB (ref 8.4–10.5)
CO2: 20 meq/L (ref 19–32)
Chloride: 105 mEq/L (ref 96–112)
Creatinine, Ser: 0.63 mg/dL (ref 0.50–1.10)
GFR calc Af Amer: >90 mL/min (ref >90)
GFR calc non Af Amer: >90 mL/min (ref >90)
Glucose, Bld: 78 mg/dL (ref 70–99)
Potassium: 3.4 mEq/L — ABNORMAL LOW (ref 3.7–5.3)
Sodium: 136 mEq/L — ABNORMAL LOW (ref 137–147)
Total Bilirubin: 1.1 mg/dL (ref 0.3–1.2)
Total Protein: 4.8 g/dL — ABNORMAL LOW (ref 6.0–8.3)

## 2014-04-21 LAB — TYPE AND SCREEN
ABO/RH(D): O POS
ANTIBODY SCREEN: NEGATIVE
UNIT DIVISION: 0
Unit division: 0

## 2014-04-21 LAB — MRSA PCR SCREENING: MRSA BY PCR: NEGATIVE

## 2014-04-21 LAB — CBC
HCT: 28.7 % — ABNORMAL LOW (ref 36.0–46.0)
HEMATOCRIT: 26.5 % — AB (ref 36.0–46.0)
HEMATOCRIT: 25.5 % — AB (ref 36.0–46.0)
Hemoglobin: 8.6 g/dL — ABNORMAL LOW (ref 12.0–15.0)
Hemoglobin: 8.8 g/dL — ABNORMAL LOW (ref 12.0–15.0)
Hemoglobin: 9.7 g/dL — ABNORMAL LOW (ref 12.0–15.0)
MCH: 26.4 pg (ref 26.0–34.0)
MCH: 27.6 pg (ref 26.0–34.0)
MCH: 27.7 pg (ref 26.0–34.0)
MCHC: 33.2 g/dL (ref 30.0–36.0)
MCHC: 33.7 g/dL (ref 30.0–36.0)
MCHC: 33.8 g/dL (ref 30.0–36.0)
MCV: 79.6 fL (ref 78.0–100.0)
MCV: 81.5 fL (ref 78.0–100.0)
MCV: 82 fL (ref 78.0–100.0)
PLATELETS: 184 10*3/uL (ref 150–400)
PLATELETS: 181 10*3/uL (ref 150–400)
PLATELETS: 187 10*3/uL (ref 150–400)
RBC: 3.11 MIL/uL — ABNORMAL LOW (ref 3.87–5.11)
RBC: 3.33 MIL/uL — ABNORMAL LOW (ref 3.87–5.11)
RBC: 3.52 MIL/uL — ABNORMAL LOW (ref 3.87–5.11)
RDW: 13.7 % (ref 11.5–15.5)
RDW: 13.8 % (ref 11.5–15.5)
RDW: 14.5 % (ref 11.5–15.5)
WBC: 10.3 10*3/uL (ref 4.0–10.5)
WBC: 8 10*3/uL (ref 4.0–10.5)
WBC: 11 10*3/uL — AB (ref 4.0–10.5)

## 2014-04-21 MED ORDER — PANTOPRAZOLE SODIUM 40 MG PO TBEC
40.0000 mg | DELAYED_RELEASE_TABLET | Freq: Every day | ORAL | Status: DC
  Administered 2014-04-21 – 2014-04-23 (×3): 40 mg via ORAL
  Filled 2014-04-21 (×4): qty 1

## 2014-04-21 MED ORDER — SODIUM CHLORIDE 0.9 % IV SOLN
INTRAVENOUS | Status: DC
  Administered 2014-04-21: 20:00:00 via INTRAVENOUS
  Administered 2014-04-21: 125 mL/h via INTRAVENOUS
  Administered 2014-04-22 (×2): via INTRAVENOUS

## 2014-04-21 NOTE — Clinical Documentation Improvement (Signed)
Please clarify if HIV positive can be further specified as one of the diagnoses listed below and document in pn or d/c summary.   Possible Clinical Conditions?  AIDS HIV Disease AIDS Related Complex HIV (+) unsymptomatic Symptomatic HIV  Other Condition Cannot Clinically Determine   Supporting Information: Risk Factors: HIV positive, GIB, ABLA  Signs & Symptoms: Kaposi Sarcoma Diagnostics:  Treatment multiple antiretroviral therapy supervised by physicians at [facility], Heloise Beecham ,RN Clinical Documentation Specialist:  [facility]

## 2014-04-21 NOTE — Progress Notes (Signed)
Daily Rounding Note  04/21/2014, 8:20 AM  LOS: 1 day   SUBJECTIVE:       No further vomiting.  No nausea.  No pain, dizziness or headache.  Stool this AM was black.    OBJECTIVE:         Vital signs in last 24 hours:    Temp:  [97.8 F (36.6 C)-99.1 F (37.3 C)] 98.8 F (37.1 C) (11/24 0803) Pulse Rate:  [82-152] 87 (11/24 0803) Resp:  [11-25] 14 (11/24 0803) BP: (72-127)/(31-77) 90/57 mmHg (11/24 0803) SpO2:  [98 %-100 %] 100 % (11/24 0803) Weight:  [190 lb (86.183 kg)] 190 lb (86.183 kg) (11/23 1046) Last BM Date: 04/19/14 Filed Weights   04/20/14 1046  Weight: 190 lb (86.183 kg)   General: looks well   Heart: RRR Chest: clear bil.   Abdomen: soft, NT, ND.  BS active  Extremities: no CCE Neuro/Psych:  Oriented x 3, no gross deficits, relaxed.   Intake/Output from previous day: 11/23 0701 - 11/24 0700 In: 1355.8 [I.V.:250.8; Blood:1005; IV Piggyback:100] Out: 850 [Urine:850]  Intake/Output this shift:    Lab Results:  Recent Labs  04/20/14 1109 04/20/14 2357 04/21/14 0107  WBC 8.7 11.0* 10.3  HGB 8.3* 9.7* 8.8*  HCT 25.8* 28.7* 26.5*  PLT 220 187 181   BMET  Recent Labs  04/20/14 1109 04/21/14 0107  NA 138 136*  K 4.4 3.4*  CL 107 105  CO2 23 20  GLUCOSE 113* 78  BUN 34* 14  CREATININE 0.63 0.63  CALCIUM 7.8* 7.9*   LFT  Recent Labs  04/20/14 1109 04/21/14 0107  PROT 5.4* 4.8*  ALBUMIN 2.7* 2.5*  AST 14 17  ALT 17 14  ALKPHOS 64 53  BILITOT 0.3 1.1   PT/INR  Recent Labs  04/20/14 1109  LABPROT 15.3*  INR 1.19   Hepatitis Panel No results for input(s): HEPBSAG, HCVAB, HEPAIGM, HEPBIGM in the last 72 hours.  Studies/Results: Dg Chest Port 1 View  04/20/2014   CLINICAL DATA:  Hematemesis, syncope, headache last night, dizziness, tachycardia, hypotensive  EXAM: PORTABLE CHEST - 1 VIEW  COMPARISON:  Portable exam 1123 hr compared to 08/12/2013  FINDINGS: Normal heart  size, mediastinal contours, and pulmonary vascularity.  Lungs clear.  No pleural effusion or pneumothorax.  Bones unremarkable.  IMPRESSION: No acute abnormalities.   Electronically Signed   By: Lavonia Dana M.D.   On: 04/20/2014 11:54   Scheduled Meds: . sodium chloride   Intravenous Once  . [START ON 04/24/2014] pantoprazole (PROTONIX) IV  40 mg Intravenous Q12H  . sodium chloride  1,000 mL Intravenous Once   Continuous Infusions: . pantoprozole (PROTONIX) infusion 8 mg/hr (04/20/14 1822)   PRN Meds:.acetaminophen **OR** acetaminophen, ondansetron (ZOFRAN) IV   ASSESMENT:   *  Hematemesis. EGD 04/20/13: no abnormalities or blood.   *  ABL anemia, symptomatic with dizziness and orthostasis.   *  *  HIV/AIDS. On multiple outpt meds that need to be restarted.    PLAN   *  switch to po protonix, brief course as this is entirely empiric at this point.  *  Start back all her home meds.  *  Full liquid diet, advance as tolerated.     Azucena Freed  04/21/2014, 8:20 AM Pager: [phone]  GI Attending Note  I have personally taken an interval history, reviewed the chart, and examined the patient.  She has had no further bleeding.  Etiology for  bleeding has not been determined.  Possibilities include a Dielofoy's ulcer or small bowel source.  Kaposi's sarcoma can metastasize to the GI tract.  Recommend outpatient capsule study to rule out a small bowel mucosal lesion.  Sandy Salaam. Deatra Ina, MD, [facility] [phone]

## 2014-04-21 NOTE — Progress Notes (Signed)
Brandy Mendez 1 - Stepdown / ICU Progress Note  Brandy Mendez MOQ:[id number] DOB: Aug 06, 1975 DOA: 04/20/2014 PCP: No primary care provider on file.   Brief narrative: 38 year old female patient with a known prior history of 042 that initially manifested as Kaposi sarcoma. She has followed by physicians at [facility]. Reported that she was in her usual state of health until 24 hours prior to admission when she had developed a headache and took 2 Aleve. After drinking glass of water she noticed that her heart was racing and she felt dizzy. The following day she continued to feel lightheaded and dizzy and vomited bright red blood with clots 3 times so called the paramedics and was brought to the emergency department.  After arrival she had no further episodes of hematemesis. Her hemoglobin was 8.3. When records from [facility] were reviewed through care everywhere it was determined patient's baseline hemoglobin is around 13.  On the date of admission she was evaluated by gastroenterology and underwent emergent EGD. No findings on EGD to explain bleeding.  HPI/Subjective: Alert and without any further hematemesis. No reported melena. Still complaining of generalized malaise.  Assessment/Plan: Active Problems:   Upper GI bleed -EGD negative- etiology unclear -CT abdomen without evidence of small bowel neoplastic disease -GI recommends continue PPI; repeat endoscopy for overt bleeding; if negative patient will need a capsule study -GI slowly advancing diet    Acute blood loss anemia -Baseline hemoglobin around 13 -Current hemoglobin 8.8 -Follow CBC every 12 hours    Orthostatic hypotension -Due to acute blood loss anemia -Continue IV fluids for now -BP remains soft    Hypokalemia -Replete when necessary and follow labs  HIV  -Confirmed diagnosis with patient -States initial CD4 count was less than 10 but now greater than 200; states viral load around 20 when last  checked -HSV-2 positive -Endorsed detailed history extending back to when she resided in her home country of [place] that included the unfortunate circumstances surrounding She became infected; she presented to the [place] as a refugee with her 2 children/both of whom have graduated high school and are pursuing post-high school degrees -Patient states that the past 2 weeks has worked 109 hours-encouraged her if possible to back down on her work hours to help protect her health -Continue previously prescribed HAART therapies -Patient confirmed only her husband is aware of this diagnosis   DVT prophylaxis: SCDs Code Status: Full Family Communication: No family at bedside-please keep in mind her husband is aware of her 042 diagnosis Disposition Plan/Expected LOS: Remaining stepdown-lipid pressures remain soft   Consultants: Gastroenterology/Dr. Olevia Mendez  Procedures: EGD: No abnormal findings  Cultures: None  Antibiotics: None  Objective: Blood pressure 106/71, pulse 99, temperature 98.3 F (36.8 C), temperature source Oral, resp. rate 18, height 5\' 6"  (1.676 m), weight 190 lb (86.183 kg), last menstrual period 04/19/2014, SpO2 100 %.  Intake/Output Summary (Last 24 hours) at 04/21/14 1359 Last data filed at 04/21/14 0700  Gross per 24 hour  Intake 1355.83 ml  Output    850 ml  Net 505.83 ml     Exam: Gen: No acute respiratory distress Chest: Clear to auscultation bilaterally without wheezes, rhonchi or crackles, room air Cardiac: Regular rate and rhythm, S1-S2, no rubs murmurs or gallops, no peripheral edema, no JVD-systolic blood pressure soft Abdomen: Soft nontender nondistended without obvious hepatosplenomegaly, no ascites Extremities: Symmetrical in appearance without cyanosis, clubbing or effusion  Scheduled Meds:  Scheduled Meds: . pantoprazole  40 mg Oral Q0600  .  sodium chloride  1,000 mL Intravenous Once   Continuous Infusions: . sodium chloride 125 mL/hr (04/21/14  1156)    Data Reviewed: Basic Metabolic Panel:  Recent Labs Lab 04/20/14 1109 04/21/14 0107  NA 138 136*  K 4.4 3.4*  CL 107 105  CO2 23 20  GLUCOSE 113* 78  BUN 34* 14  CREATININE 0.63 0.63  CALCIUM 7.8* 7.9*   Liver Function Tests:  Recent Labs Lab 04/20/14 1109 04/21/14 0107  AST 14 17  ALT 17 14  ALKPHOS 64 53  BILITOT 0.3 1.1  PROT 5.4* 4.8*  ALBUMIN 2.7* 2.5*   No results for input(s): LIPASE, AMYLASE in the last 168 hours. No results for input(s): AMMONIA in the last 168 hours. CBC:  Recent Labs Lab 04/20/14 1109 04/20/14 2357 04/21/14 0107  WBC 8.7 11.0* 10.3  NEUTROABS 6.3  --   --   HGB 8.3* 9.7* 8.8*  HCT 25.8* 28.7* 26.5*  MCV 82.2 81.5 79.6  PLT 220 187 181   Cardiac Enzymes: No results for input(s): CKTOTAL, CKMB, CKMBINDEX, TROPONINI in the last 168 hours. BNP (last 3 results) No results for input(s): PROBNP in the last 8760 hours. CBG: No results for input(s): GLUCAP in the last 168 hours.  Recent Results (from the past 240 hour(s))  MRSA PCR Screening     Status: None   Collection Time: 04/20/14  7:14 PM  Result Value Ref Range Status   MRSA by PCR NEGATIVE NEGATIVE Final    Comment:        The GeneXpert MRSA Assay (FDA approved for NASAL specimens only), is one component of a comprehensive MRSA colonization surveillance program. It is not intended to diagnose MRSA infection nor to guide or monitor treatment for MRSA infections.      Studies:  Recent x-ray studies have been reviewed in detail by the Attending Physician  Time spent :      Brandy Mendez  [phone] Pager [phone]   **If unable to reach the above provider after paging please contact the Brandy Mendez @ [phone]  On-Call/Text Page:      [email or web address]      password [email or web address]  If 7PM-7AM, please contact night-coverage [email or web address] Password [email or web address] 04/21/2014, 1:59 PM   LOS: 1 day   Examined patient and discussed  assessment and plan with ANP Brandy Mendez and agree with above. Patient with multiple complex medical issues> 40 minutes spent in direct patient care

## 2014-04-22 LAB — BASIC METABOLIC PANEL
Anion gap: 7 (ref 5–15)
BUN: 5 mg/dL — AB (ref 6–23)
CALCIUM: 7.8 mg/dL — AB (ref 8.4–10.5)
CO2: 24 meq/L (ref 19–32)
Chloride: 109 mEq/L (ref 96–112)
Creatinine, Ser: 0.63 mg/dL (ref 0.50–1.10)
GFR calc Af Amer: >90 mL/min (ref >90)
GFR calc non Af Amer: >90 mL/min (ref >90)
GLUCOSE: 87 mg/dL (ref 70–99)
Potassium: 3.4 mEq/L — ABNORMAL LOW (ref 3.7–5.3)
Sodium: 140 mEq/L (ref 137–147)

## 2014-04-22 LAB — URINALYSIS, ROUTINE W REFLEX MICROSCOPIC
Bilirubin Urine: NEGATIVE
Glucose, UA: NEGATIVE mg/dL
Hgb urine dipstick: NEGATIVE
Ketones, ur: NEGATIVE mg/dL
LEUKOCYTES UA: NEGATIVE
NITRITE: NEGATIVE
PROTEIN: NEGATIVE mg/dL
SPECIFIC GRAVITY, URINE: 1.008 (ref 1.005–1.030)
Urobilinogen, UA: 1 mg/dL (ref 0.0–1.0)
pH: 6.5 (ref 5.0–8.0)

## 2014-04-22 LAB — CBC
HEMATOCRIT: 25.1 % — AB (ref 36.0–46.0)
HEMOGLOBIN: 8.2 g/dL — AB (ref 12.0–15.0)
MCH: 26.5 pg (ref 26.0–34.0)
MCHC: 32.7 g/dL (ref 30.0–36.0)
MCV: 81.2 fL (ref 78.0–100.0)
Platelets: 185 10*3/uL (ref 150–400)
RBC: 3.09 MIL/uL — AB (ref 3.87–5.11)
RDW: 14.7 % (ref 11.5–15.5)
WBC: 7 10*3/uL (ref 4.0–10.5)

## 2014-04-22 MED ORDER — DOLUTEGRAVIR SODIUM 50 MG PO TABS
50.0000 mg | ORAL_TABLET | Freq: Every day | ORAL | Status: DC
  Administered 2014-04-22 – 2014-04-23 (×2): 50 mg via ORAL
  Filled 2014-04-22 (×2): qty 1

## 2014-04-22 MED ORDER — EMTRICITABINE-TENOFOVIR DF 200-300 MG PO TABS
1.0000 | ORAL_TABLET | Freq: Every day | ORAL | Status: DC
  Administered 2014-04-22 – 2014-04-23 (×2): 1 via ORAL
  Filled 2014-04-22 (×2): qty 1

## 2014-04-22 MED ORDER — SULFAMETHOXAZOLE-TRIMETHOPRIM 800-160 MG PO TABS
1.0000 | ORAL_TABLET | Freq: Every day | ORAL | Status: DC
  Administered 2014-04-22 – 2014-04-23 (×2): 1 via ORAL
  Filled 2014-04-22 (×2): qty 1

## 2014-04-22 NOTE — Progress Notes (Signed)
[name]  Brandy Mendez GYJ:[id number] DOB: [birth year] DOA: 04/20/2014 PCP: No primary care provider on file.  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 She is followed at [facility]. She was in her usual state of health until 24 hours prior to admission when she had developed a headache and took 2 Aleve. After drinking a glass of water she noticed that her heart was racing and she felt dizzy. The following day she continued to feel lightheaded and dizzy and vomited bright red blood with clots 3 times so she called the paramedics and was brought to the emergency department.  After arrival she had no further episodes of hematemesis. Her hemoglobin was 8.3. When records from [facility] were reviewed it was determined patient's baseline hemoglobin is around 13.  On the date of admission she was evaluated by Gastroenterology and underwent emergent EGD. No findings on EGD to explain bleeding.  HPI/Subjective: Alert and no further GIB - still endorses weakness  Assessment/Plan:    Upper GI bleed -EGD negative- etiology unclear -CT abdomen without evidence of small bowel neoplastic disease -GI recommends continue PPI -GI office will contact pt to schedule OP capsule endoscopy -advance diet as tolerated    Acute blood loss anemia -Baseline hemoglobin around 13 -Current hemoglobin has drifted down to 8.2  -CBC in AM    Orthostatic hypotension -Due to acute blood loss anemia -IVFs continue    Hypokalemia -Replete when necessary and follow labs  HIV  -Confirmed diagnosis with patient -States initial CD4 count was less than 10 but now greater than 200; states viral load around 20 when last checked -HSV-2 positive -Continue previously prescribed HAART therapies -Patient confirmed only her husband is aware of this diagnosis  DVT prophylaxis: SCDs Code Status:  Full Family Communication: Husband at bedside-REMINDER: husband is only family member aware of her 042 diagnosis Disposition Plan/Expected LOS: Transfer to floor- keep on Team 1 for possible dc in AM   Consultants: Gastroenterology/Dr. Olevia Perches  Procedures: EGD: No abnormal findings  Cultures: None  Antibiotics: None  Objective: Blood pressure 101/67, pulse 83, temperature 98.7 F (37.1 C), temperature source Oral, resp. rate 15, height 5\' 6"  (1.676 m), weight 198 lb 10.2 oz (90.1 kg), last menstrual period 04/19/2014, SpO2 100 %.  Intake/Output Summary (Last 24 hours) at 04/22/14 1357 Last data filed at 04/22/14 1000  Gross per 24 hour  Intake 2668.33 ml  Output    350 ml  Net 2318.33 ml   Exam: Gen: No acute respiratory distress Chest: Clear to auscultation bilaterally, room air Cardiac: Regular rate and rhythm, S1-S2, no rubs murmurs or gallops, no peripheral edema, no JVD Abdomen: Soft nontender nondistended without obvious hepatosplenomegaly, no ascites Extremities: Symmetrical in appearance without cyanosis, clubbing or effusion  Scheduled Meds:  Scheduled Meds: . dolutegravir  50 mg Oral Daily  . emtricitabine-tenofovir  1 tablet Oral Daily  . pantoprazole  40 mg Oral Q0600  . sulfamethoxazole-trimethoprim  1 tablet Oral Daily   Data Reviewed: Basic Metabolic Panel:  Recent Labs Lab 04/20/14 1109 04/21/14 0107 04/22/14 0417  NA 138 136* 140  K 4.4 3.4* 3.4*  CL 107 105 109  CO2 23 20 24   GLUCOSE 113* 78 87  BUN 34* 14 5*  CREATININE 0.63 0.63 0.63  CALCIUM 7.8* 7.9* 7.8*   Liver Function Tests:  Recent Labs Lab 04/20/14 1109 04/21/14 0107  AST 14 17  ALT 17 14  ALKPHOS 64 53  BILITOT 0.3 1.1  PROT 5.4* 4.8*  ALBUMIN 2.7* 2.5*   CBC:  Recent Labs Lab 04/20/14 1109 04/20/14 2357 04/21/14 0107 04/21/14 1811 04/22/14 0417  WBC 8.7 11.0* 10.3 8.0 7.0  NEUTROABS 6.3  --   --   --   --   HGB 8.3* 9.7* 8.8* 8.6* 8.2*  HCT 25.8* 28.7*  26.5* 25.5* 25.1*  MCV 82.2 81.5 79.6 82.0 81.2  PLT 220 187 181 184 185     Recent Results (from the past 240 hour(s))  MRSA PCR Screening     Status: None   Collection Time: 04/20/14  7:14 PM  Result Value Ref Range Status   MRSA by PCR NEGATIVE NEGATIVE Final    Comment:        The GeneXpert MRSA Assay (FDA approved for NASAL specimens only), is one component of a comprehensive MRSA colonization surveillance program. It is not intended to diagnose MRSA infection nor to guide or monitor treatment for MRSA infections.      Studies:  Recent x-ray studies have been reviewed in detail by the Attending Physician  Time spent :  [place], ANP [facility] Office  [phone] Pager [phone]  On-Call/Text Page:      [email or web address]      password [email or web address]  If 7PM-7AM, please contact night-coverage [email or web address] Password [email or web address] 04/22/2014, 1:57 PM   LOS: 2 days   I have personally examined this patient and reviewed the entire database. I have reviewed the above note, made any necessary editorial changes, and agree with its content.  Cherene Altes, MD [facility]

## 2014-04-22 NOTE — Discharge Instructions (Signed)
You will be contacted by [facility] to schedule a capsule endoscopy.

## 2014-04-22 NOTE — Plan of Care (Signed)
Problem: Phase II Progression Outcomes Goal: Tolerating diet Outcome: Completed/Met Date Met:  04/22/14     

## 2014-04-22 NOTE — Progress Notes (Signed)
[facility] Progress Note  Subjective:  NO vomiting. NO dark stool. Some lightheadedness.   Objective:  Vital signs in last 24 hours: Temp:  [98.3 F (36.8 C)-99 F (37.2 C)] 98.4 F (36.9 C) (11/25 0834) Pulse Rate:  [83-99] 83 (11/25 0347) Resp:  [13-18] 15 (11/25 0347) BP: (90-106)/(44-71) 105/61 mmHg (11/25 0834) SpO2:  [100 %] 100 % (11/24 2359) Weight:  [198 lb 10.2 oz (90.1 kg)] 198 lb 10.2 oz (90.1 kg) (11/25 0347) Last BM Date: 04/21/14 General:   Alert,  Well-developed, female   in NAD Exam not done at this time as pt changing, applying monitors with nurse   Lab Results:  Recent Labs  04/21/14 0107 04/21/14 1811 04/22/14 0417  WBC 10.3 8.0 7.0  HGB 8.8* 8.6* 8.2*  HCT 26.5* 25.5* 25.1*  PLT 181 184 185   BMET  Recent Labs  04/20/14 1109 04/21/14 0107 04/22/14 0417  NA 138 136* 140  K 4.4 3.4* 3.4*  CL 107 105 109  CO2 23 20 24   GLUCOSE 113* 78 87  BUN 34* 14 5*  CREATININE 0.63 0.63 0.63  CALCIUM 7.8* 7.9* 7.8*   LFT  Recent Labs  04/21/14 0107  PROT 4.8*  ALBUMIN 2.5*  AST 17  ALT 14  ALKPHOS 53  BILITOT 1.1   PT/INR  Recent Labs  04/20/14 1109  LABPROT 15.3*  INR 1.19   Hepatitis Panel No results for input(s): HEPBSAG, HCVAB, HEPAIGM, HEPBIGM in the last 72 hours.  Ct Abdomen Pelvis W Wo Contrast  04/21/2014   CLINICAL DATA:  Vomiting blood, concern for GI bleed  EXAM: CT ABDOMEN AND PELVIS WITHOUT AND WITH CONTRAST  TECHNIQUE: Multidetector CT imaging of the abdomen and pelvis was performed following the standard protocol before and following the bolus administration of intravenous contrast.  CONTRAST:  187mL OMNIPAQUE IOHEXOL 300 MG/ML  SOLN  COMPARISON:  None available  FINDINGS: Lower chest:  Lung bases are clear per  Hepatobiliary: No focal hepatic lesion. Post cholecystectomy. No biliary duct dilatation  Pancreas: Pancreas is normal. No ductal dilatation. No pancreatic inflammation.  Spleen: Normal  spleen.  Adrenals/urinary tract: Adrenal glands and kidneys are normal. Ureters and bladder normal.  Stomach/Bowel: Stomach, small bowel, appendix, cecum normal. The colon and rectosigmoid colon are normal. No evidence of mass. No significant diverticulosis. Moderate volume stool in the rectum.  Vascular/Lymphatic: Abdominal aorta is normal caliber. There is no retroperitoneal or periportal lymphadenopathy. No pelvic lymphadenopathy.  Reproductive: Uterus is lobular with rounded enhancing exophytic masses and intramural masses consistent with leiomyomas. The ovaries are normal.  Musculoskeletal: No aggressive osseous lesion. Again demonstrated lucent lesion in the right iliac wing which is not changed.  Other: No free fluid.  No hernia  IMPRESSION: 1. No explanation for gastrointestinal bleeding. No diverticulosis or mass of the GI tract. 2. Normal appendix. 3. Multiple uterine masses most consistent with leiomyomas.   Electronically Signed   By: Suzy Bouchard M.D.   On: 04/21/2014 08:18   Dg Chest Port 1 View  04/20/2014   CLINICAL DATA:  Hematemesis, syncope, headache last night, dizziness, tachycardia, hypotensive  EXAM: PORTABLE CHEST - 1 VIEW  COMPARISON:  Portable exam 1123 hr compared to 08/12/2013  FINDINGS: Normal heart size, mediastinal contours, and pulmonary vascularity.  Lungs clear.  No pleural effusion or pneumothorax.  Bones unremarkable.  IMPRESSION: No acute abnormalities.   Electronically Signed   By: Lavonia Dana M.D.   On: 04/20/2014 11:54  ASSESSMENT/PLAN:  38 yo female admitted with hematemesis. Had EGD 04/20/14 no abnormalities or blood. ABL anemia, symptomatic with lightheadedness HIV/AIDS--on multiple meds as outpt--to be restarted. GI office aware of pt and will contact her next week to schedule capsule endoscopy.      LOS: 2 days   Hvozdovic, Deloris Ping 04/22/2014, Pager [phone]  GI Attending Note  I have personally taken an interval history, reviewed the  chart, and examined the patient.  I agree with the extender's note, impression and recommendations.  Sandy Salaam. Deatra Ina, MD, [facility] [phone]

## 2014-04-22 NOTE — Plan of Care (Signed)
Problem: Phase II Progression Outcomes Goal: No active bleeding Outcome: Progressing                  

## 2014-04-23 LAB — URINE CULTURE
COLONY COUNT: NO GROWTH
Culture: NO GROWTH

## 2014-04-23 LAB — CBC
HCT: 28.3 % — ABNORMAL LOW (ref 36.0–46.0)
Hemoglobin: 9.3 g/dL — ABNORMAL LOW (ref 12.0–15.0)
MCH: 26.6 pg (ref 26.0–34.0)
MCHC: 32.9 g/dL (ref 30.0–36.0)
MCV: 80.9 fL (ref 78.0–100.0)
Platelets: 231 10*3/uL (ref 150–400)
RBC: 3.5 MIL/uL — AB (ref 3.87–5.11)
RDW: 14.5 % (ref 11.5–15.5)
WBC: 8.1 10*3/uL (ref 4.0–10.5)

## 2014-04-23 MED ORDER — PANTOPRAZOLE SODIUM 40 MG PO TBEC: 40.0000 mg | DELAYED_RELEASE_TABLET | Freq: Two times a day (BID) | ORAL | Status: AC

## 2014-04-23 NOTE — Discharge Summary (Signed)
Physician Discharge Summary  Brandy Mendez VOZ:[id number] DOB: [birth year] DOA: 04/20/2014  PCP: No primary care provider on file.  Admit date: 04/20/2014 Discharge date: 04/23/2014  Time spent: 40 minutes  Recommendations for Outpatient Follow-up:   Upper GI bleed -EGD negative- etiology unclear -CT abdomen without evidence of small bowel neoplastic disease -GI recommends continue PPI -Hold aspirin, and NSAIDs as these can increase risk of GI bleed -GI office will contact pt to schedule OP capsule endoscopy   Acute blood loss anemia -Baseline hemoglobin around 13 -At discharge hemoglobin trending up currently at 9.3  -Follow-up with PCP, and GI to monitor H/H and complete capsule study   Orthostatic hypotension -Resolved   Hypokalemia -Mildly hypokalemic, correct with diet.  -PCP, GI, and infectious disease to monitor  HIV  -States initial CD4 count was less than 10 but now greater than 200; states viral load around 20 when last checked -HSV-2 positive -Continue previously prescribed HAART therapies -Patient confirmed only her husband is aware of this diagnosis -Follow-up with infectious disease physician at [facility]     Discharge Diagnoses:  Active Problems:   Upper GI bleed   Acute blood loss anemia   Orthostatic hypotension   042   Hypokalemia   Bleeding gastrointestinal   Discharge Condition: Stable  Diet recommendation: Regular  Filed Weights   04/20/14 1046 04/22/14 0347  Weight: 86.183 kg (190 lb) 90.1 kg (198 lb 10.2 oz)    History of present illness:  38 year old female patient with a known prior history of 042 that initially manifested as Kaposi sarcoma. She has followed by physicians at [facility]. Reported that she was in her usual state of health until 24 hours prior to admission when she had developed a headache and took 2 Aleve. After drinking glass of water she noticed that her heart was racing and she felt dizzy. The following day  she continued to feel lightheaded and dizzy and vomited bright red blood with clots 3 times so called the paramedics and was brought to the emergency department.  After arrival she had no further episodes of hematemesis. Her hemoglobin was 8.3. When records from [facility] were reviewed through care everywhere it was determined patient's baseline hemoglobin is around 13.  On the date of admission she was evaluated by gastroenterology and underwent emergent EGD. No findings on EGD to explain bleeding. Since admission patient's hemoglobin level has been stable, negative orthostatic vitals, ambulatory SPO2 showed patient on room air to have SPO2= 100%. Negative lightheadedness or dizziness was expressed by patient during her ambulation.   Procedures: EGD: No abnormal findings   Consultations: Gastroenterology/Dr. Olevia Perches   Antibiotics Tivicay>> per her ID physician Truvada>> per her ID physician Bactrim DS>> per her ID physician   Discharge Exam: Filed Vitals:   04/23/14 0533 04/23/14 0742 04/23/14 0753 04/23/14 0756  BP: 94/52 98/50 111/69 115/84  Pulse: 95 91    Temp: 98.4 F (36.9 C)     TempSrc: Oral     Resp: 12     Height:      Weight:      SpO2: 100% 100%  100%    Gen: No acute respiratory distress Chest: Clear to auscultation bilaterally without wheezes, rhonchi or crackles, room air Cardiac: Regular rate and rhythm, S1-S2, no rubs murmurs or gallops, no peripheral edema, no JVD-systolic blood pressure soft Abdomen: Soft nontender nondistended without obvious hepatosplenomegaly, no ascites Extremities: Symmetrical in appearance without cyanosis, clubbing or effusion    Discharge Instructions  Medication List    ASK your doctor about these medications        ASPIRIN PO  Take 1 tablet by mouth 2 (two) times daily as needed (Says she takes when feeling weak).     naproxen sodium 220 MG tablet  Commonly known as:  ANAPROX  Take 440 mg by mouth daily as  needed (headache).     sulfamethoxazole-trimethoprim 800-160 MG per tablet  Commonly known as:  BACTRIM DS,SEPTRA DS  Take 1 tablet by mouth daily.     TIVICAY 50 MG tablet  Generic drug:  dolutegravir  Take 1 tablet by mouth daily.     TRUVADA 200-300 MG per tablet  Generic drug:  emtricitabine-tenofovir  Take 1 tablet by mouth daily.       Allergies  Allergen Reactions  . Penicillins Rash      The results of significant diagnostics from this hospitalization (including imaging, microbiology, ancillary and laboratory) are listed below for reference.    Significant Diagnostic Studies: Ct Abdomen Pelvis W Wo Contrast  04/21/2014   CLINICAL DATA:  Vomiting blood, concern for GI bleed  EXAM: CT ABDOMEN AND PELVIS WITHOUT AND WITH CONTRAST  TECHNIQUE: Multidetector CT imaging of the abdomen and pelvis was performed following the standard protocol before and following the bolus administration of intravenous contrast.  CONTRAST:  167mL OMNIPAQUE IOHEXOL 300 MG/ML  SOLN  COMPARISON:  None available  FINDINGS: Lower chest:  Lung bases are clear per  Hepatobiliary: No focal hepatic lesion. Post cholecystectomy. No biliary duct dilatation  Pancreas: Pancreas is normal. No ductal dilatation. No pancreatic inflammation.  Spleen: Normal spleen.  Adrenals/urinary tract: Adrenal glands and kidneys are normal. Ureters and bladder normal.  Stomach/Bowel: Stomach, small bowel, appendix, cecum normal. The colon and rectosigmoid colon are normal. No evidence of mass. No significant diverticulosis. Moderate volume stool in the rectum.  Vascular/Lymphatic: Abdominal aorta is normal caliber. There is no retroperitoneal or periportal lymphadenopathy. No pelvic lymphadenopathy.  Reproductive: Uterus is lobular with rounded enhancing exophytic masses and intramural masses consistent with leiomyomas. The ovaries are normal.  Musculoskeletal: No aggressive osseous lesion. Again demonstrated lucent lesion in the right  iliac wing which is not changed.  Other: No free fluid.  No hernia  IMPRESSION: 1. No explanation for gastrointestinal bleeding. No diverticulosis or mass of the GI tract. 2. Normal appendix. 3. Multiple uterine masses most consistent with leiomyomas.   Electronically Signed   By: Suzy Bouchard M.D.   On: 04/21/2014 08:18   Dg Chest Port 1 View  04/20/2014   CLINICAL DATA:  Hematemesis, syncope, headache last night, dizziness, tachycardia, hypotensive  EXAM: PORTABLE CHEST - 1 VIEW  COMPARISON:  Portable exam 1123 hr compared to 08/12/2013  FINDINGS: Normal heart size, mediastinal contours, and pulmonary vascularity.  Lungs clear.  No pleural effusion or pneumothorax.  Bones unremarkable.  IMPRESSION: No acute abnormalities.   Electronically Signed   By: Lavonia Dana M.D.   On: 04/20/2014 11:54    Microbiology: Recent Results (from the past 240 hour(s))  MRSA PCR Screening     Status: None   Collection Time: 04/20/14  7:14 PM  Result Value Ref Range Status   MRSA by PCR NEGATIVE NEGATIVE Final    Comment:        The GeneXpert MRSA Assay (FDA approved for NASAL specimens only), is one component of a comprehensive MRSA colonization surveillance program. It is not intended to diagnose MRSA infection nor to guide or monitor treatment for MRSA  infections.   Culture, Urine     Status: None   Collection Time: 04/22/14  1:17 AM  Result Value Ref Range Status   Specimen Description URINE, CLEAN CATCH  Final   Special Requests NONE  Final   Culture  Setup Time   Final    04/22/2014 10:28 Performed at [place] Performed at [facility]   Final   Culture NO GROWTH Performed at [facility]   Final   Report Status 04/23/2014 FINAL  Final     Labs: Basic Metabolic Panel:  Recent Labs Lab 04/20/14 1109 04/21/14 0107 04/22/14 0417  NA 138 136* 140  K 4.4 3.4* 3.4*  CL 107 105 109  CO2 23 20 24   GLUCOSE 113* 78 87  BUN 34*  14 5*  CREATININE 0.63 0.63 0.63  CALCIUM 7.8* 7.9* 7.8*   Liver Function Tests:  Recent Labs Lab 04/20/14 1109 04/21/14 0107  AST 14 17  ALT 17 14  ALKPHOS 64 53  BILITOT 0.3 1.1  PROT 5.4* 4.8*  ALBUMIN 2.7* 2.5*   No results for input(s): LIPASE, AMYLASE in the last 168 hours. No results for input(s): AMMONIA in the last 168 hours. CBC:  Recent Labs Lab 04/20/14 1109 04/20/14 2357 04/21/14 0107 04/21/14 1811 04/22/14 0417 04/23/14 0410  WBC 8.7 11.0* 10.3 8.0 7.0 8.1  NEUTROABS 6.3  --   --   --   --   --   HGB 8.3* 9.7* 8.8* 8.6* 8.2* 9.3*  HCT 25.8* 28.7* 26.5* 25.5* 25.1* 28.3*  MCV 82.2 81.5 79.6 82.0 81.2 80.9  PLT 220 187 181 184 185 231   Cardiac Enzymes: No results for input(s): CKTOTAL, CKMB, CKMBINDEX, TROPONINI in the last 168 hours. BNP: BNP (last 3 results) No results for input(s): PROBNP in the last 8760 hours. CBG: No results for input(s): GLUCAP in the last 168 hours.     Signed:  Dia Crawford, MD [facility] [phone] pager

## 2014-04-23 NOTE — Progress Notes (Signed)
SATURATION QUALIFICATIONS: (This note is used to comply with regulatory documentation for home oxygen)  Patient Saturations on Room Air at Rest = 100%  Patient Saturations on Room Air while Ambulating = 100%  

## 2014-04-23 NOTE — Progress Notes (Signed)
Pt DC home with family. Verbalized understanding of DC instructions. Out via wheelchair with volunteer.

## 2014-04-27 ENCOUNTER — Telehealth: Payer: Self-pay | Admitting: *Deleted

## 2014-04-27 DIAGNOSIS — K922 Gastrointestinal hemorrhage, unspecified: Secondary | ICD-10-CM

## 2014-04-27 NOTE — Telephone Encounter (Signed)
-----   Message from Vita Barley Hvozdovic, PA-C sent at 04/22/2014  1:46 PM EST ----- Rollene Fare, Could you please call this pt Monday and get her set up for a pill endoscopy? Thank you, Cecille Rubin

## 2014-04-27 NOTE — Telephone Encounter (Signed)
Spoke with patient and scheduled teaching on 04/30/14 at 2:00 PM and SBCE on 05/07/14 at 8:00 AM.

## 2014-05-04 ENCOUNTER — Telehealth: Payer: Self-pay

## 2014-05-04 NOTE — Telephone Encounter (Signed)
Patient did not keep the appointment for Capsule Endoscopy teaching. The cell phone number is disconnected. There is no answer and no voicemail on the home number. She is scheduled to come in for the capsule on 05/07/14

## 2014-05-11 ENCOUNTER — Telehealth: Payer: Self-pay | Admitting: Gastroenterology

## 2014-05-11 NOTE — Telephone Encounter (Signed)
No answer. No voicemail. A detailed letter is mailed out today. Explanation and appointments included in the letter.

## 2014-05-11 NOTE — Telephone Encounter (Signed)
Spoke with joyce. Appointment given to her .06/09/14 for teaching and 06/11/14 for the capsule itself.

## 2014-06-10 ENCOUNTER — Telehealth: Payer: Self-pay | Admitting: *Deleted

## 2014-06-10 ENCOUNTER — Other Ambulatory Visit: Payer: Self-pay | Admitting: *Deleted

## 2014-06-10 DIAGNOSIS — K922 Gastrointestinal hemorrhage, unspecified: Secondary | ICD-10-CM

## 2014-06-10 NOTE — Telephone Encounter (Signed)
Patient arrived for SBCE teaching. Patient given written and verbal instructions. Patient voices understanding.

## 2014-06-11 ENCOUNTER — Ambulatory Visit (INDEPENDENT_AMBULATORY_CARE_PROVIDER_SITE_OTHER): Payer: Self-pay | Admitting: Gastroenterology

## 2014-06-11 DIAGNOSIS — K921 Melena: Secondary | ICD-10-CM

## 2014-06-11 NOTE — Progress Notes (Signed)
Patient here for capsule endoscopy. Tolerated procedure. Verbalizes understanding of written and verbal instructions. Lot # [id number] exp 06/2015

## 2014-06-23 ENCOUNTER — Telehealth: Payer: Self-pay | Admitting: Gastroenterology

## 2014-06-23 NOTE — Telephone Encounter (Signed)
Please advise on Capsule Endoscopy results. Thanks

## 2014-06-24 NOTE — Telephone Encounter (Signed)
Capsule study was normal.  Patient should contact us if she has any further overt GI bleeding

## 2014-06-24 NOTE — Telephone Encounter (Signed)
Results given to the daughter.  She reports the patient does not know if she passed the capsule. Okay to get a plain abdominal film?

## 2014-06-25 ENCOUNTER — Other Ambulatory Visit: Payer: Self-pay

## 2014-06-25 DIAGNOSIS — T189XXS Foreign body of alimentary tract, part unspecified, sequela: Secondary | ICD-10-CM

## 2014-06-25 NOTE — Telephone Encounter (Signed)
yes

## 2014-06-25 NOTE — Telephone Encounter (Signed)
Order placed.Daughter will bring her in asap.

## 2014-06-26 ENCOUNTER — Telehealth: Payer: Self-pay | Admitting: Gastroenterology

## 2014-06-26 NOTE — Telephone Encounter (Signed)
Patient has not gone for the KUB. She wants to wait. She is not willing to go due to the cost. Says she is already being threatened by collections. She denies any bloody stools or emesis. Agrees to check with her PCP. Her menses is late and she is not sure about pregnancy.

## 2014-07-01 NOTE — Telephone Encounter (Signed)
ok 

## 2014-07-02 ENCOUNTER — Encounter: Payer: Self-pay | Admitting: Gastroenterology

## 2018-01-03 ENCOUNTER — Emergency Department (HOSPITAL_COMMUNITY): Payer: 59

## 2018-01-03 ENCOUNTER — Encounter (HOSPITAL_COMMUNITY): Payer: Self-pay | Admitting: Emergency Medicine

## 2018-01-03 ENCOUNTER — Emergency Department (HOSPITAL_COMMUNITY)
Admission: EM | Admit: 2018-01-03 | Discharge: 2018-01-03 | Disposition: A | Discharge status: Home / Self Care | Payer: 59 | Attending: Emergency Medicine | Admitting: Emergency Medicine

## 2018-01-03 ENCOUNTER — Other Ambulatory Visit: Payer: Self-pay

## 2018-01-03 DIAGNOSIS — C469 Kaposi's sarcoma, unspecified: Secondary | ICD-10-CM | POA: Insufficient documentation

## 2018-01-03 DIAGNOSIS — R748 Abnormal levels of other serum enzymes: Secondary | ICD-10-CM | POA: Insufficient documentation

## 2018-01-03 DIAGNOSIS — B20 Human immunodeficiency virus [HIV] disease: Secondary | ICD-10-CM | POA: Diagnosis not present

## 2018-01-03 DIAGNOSIS — R1013 Epigastric pain: Secondary | ICD-10-CM | POA: Diagnosis not present

## 2018-01-03 DIAGNOSIS — R0789 Other chest pain: Secondary | ICD-10-CM | POA: Diagnosis present

## 2018-01-03 LAB — BASIC METABOLIC PANEL
Anion gap: 9 (ref 5–15)
BUN: 20 mg/dL (ref 6–20)
CHLORIDE: 102 mmol/L (ref 98–111)
CO2: 28 mmol/L (ref 22–32)
Calcium: 9.5 mg/dL (ref 8.9–10.3)
Creatinine, Ser: 0.73 mg/dL (ref 0.44–1.00)
Glucose, Bld: 112 mg/dL — ABNORMAL HIGH (ref 70–99)
Potassium: 3.4 mmol/L — ABNORMAL LOW (ref 3.5–5.1)
SODIUM: 139 mmol/L (ref 135–145)

## 2018-01-03 LAB — HEPATIC FUNCTION PANEL
ALT: 144 U/L — ABNORMAL HIGH (ref 0–44)
AST: 330 U/L — ABNORMAL HIGH (ref 15–41)
Albumin: 3.7 g/dL (ref 3.5–5.0)
Alkaline Phosphatase: 120 U/L (ref 38–126)
Bilirubin, Direct: 0.1 mg/dL (ref 0.0–0.2)
Indirect Bilirubin: 0.9 mg/dL (ref 0.3–0.9)
Total Bilirubin: 1 mg/dL (ref 0.3–1.2)
Total Protein: 7.1 g/dL (ref 6.5–8.1)

## 2018-01-03 LAB — I-STAT BETA HCG BLOOD, ED (NOT ORDERABLE): I-stat hCG, quantitative: <5 m[IU]/mL (ref <5)

## 2018-01-03 LAB — CBC
HCT: 43 % (ref 36.0–46.0)
Hemoglobin: 14.1 g/dL (ref 12.0–15.0)
MCH: 27.9 pg (ref 26.0–34.0)
MCHC: 32.8 g/dL (ref 30.0–36.0)
MCV: 85 fL (ref 78.0–100.0)
Platelets: 277 10*3/uL (ref 150–400)
RBC: 5.06 MIL/uL (ref 3.87–5.11)
RDW: 13.3 % (ref 11.5–15.5)
WBC: 5 10*3/uL (ref 4.0–10.5)

## 2018-01-03 LAB — POCT I-STAT TROPONIN I
TROPONIN I, POC: 0 ng/mL (ref 0.00–0.08)
Troponin i, poc: 0 ng/mL (ref 0.00–0.08)

## 2018-01-03 LAB — LIPASE, BLOOD: Lipase: 35 U/L (ref 11–51)

## 2018-01-03 MED ORDER — PROMETHAZINE HCL 25 MG PO TABS: 25.0000 mg | ORAL_TABLET | Freq: Four times a day (QID) | ORAL | 0 refills | Status: AC | PRN

## 2018-01-03 MED ORDER — ONDANSETRON HCL 4 MG/2ML IJ SOLN
4.0000 mg | Freq: Once | INTRAMUSCULAR | Status: AC
  Administered 2018-01-03: 4 mg via INTRAVENOUS
  Filled 2018-01-03: qty 2

## 2018-01-03 NOTE — ED Triage Notes (Signed)
Pt from home with c/o sudden onset epigastric pain. Pt states she was sleeping and pain woke her up. Pt rates pain 10/10 and denies radiation. Pt has hx of AIDS but her daughter does not know and she does not want her daughter to know. She requests history to be reviewed with family not in room. Pt has strong radial pulses

## 2018-01-03 NOTE — Discharge Instructions (Addendum)
Follow-up with whomever prescribes her medications as these can elevate your liver enzymes.  Return here as needed.
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
## 2018-01-03 NOTE — ED Notes (Addendum)
Pt refusing zofran at this time. Pt states " this happened before and there was something wrong with my heart. I want to throw up to see if it is blood again like last time. Come back in 30 mins to give the medication" Medication held at this time.

## 2019-04-19 IMAGING — US US ABDOMEN LIMITED
1 series · 14 of 25 positions shown · non-contrast
Comparison: CT abdomen and pelvis 04/20/2014

CLINICAL DATA: Acute RIGHT upper quadrant pain, AIDS

EXAM:
ULTRASOUND ABDOMEN LIMITED RIGHT UPPER QUADRANT

[Series 1: us abdomen limited · 14 of 42 slices shown]
[im 1/42]
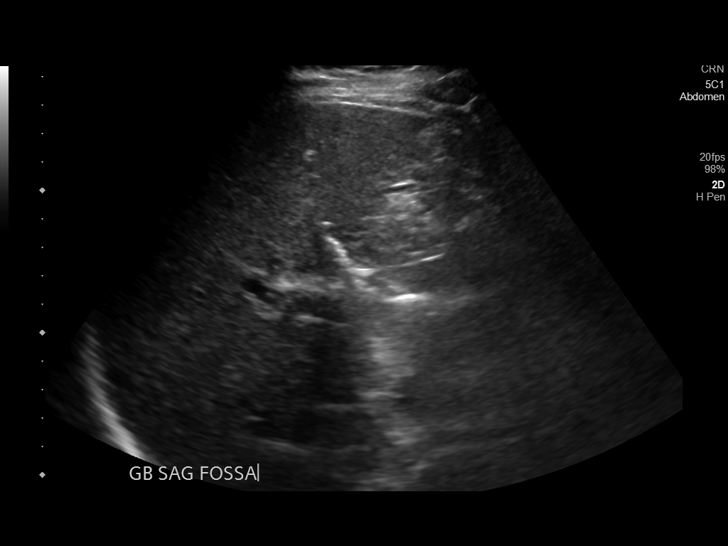
[im 4/42]
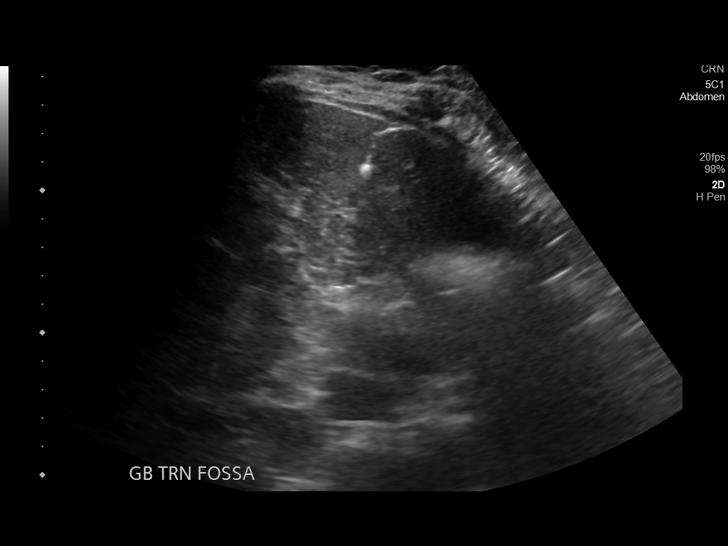
[im 7/42]
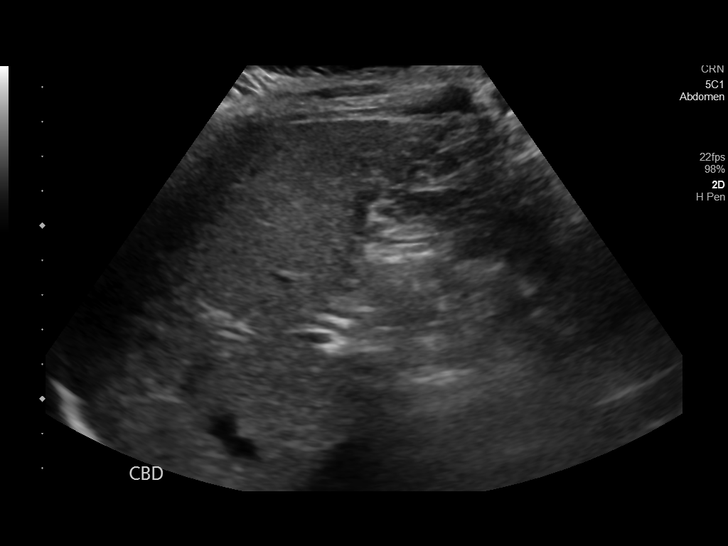
[im 11/42]
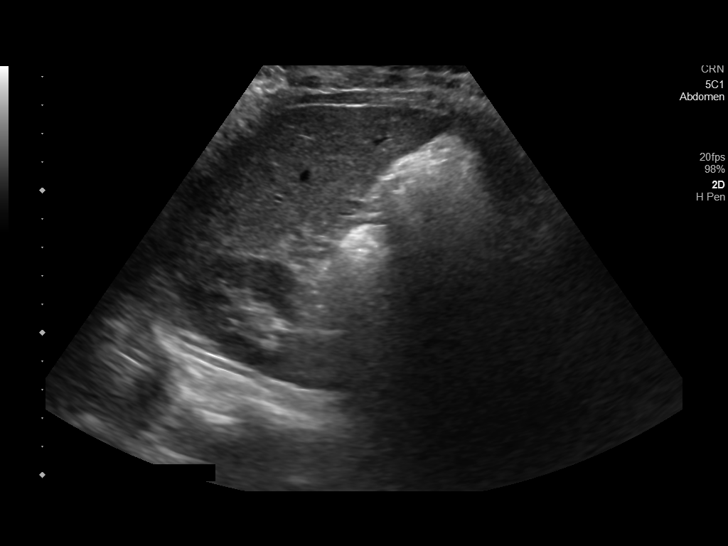
[im 14/42]
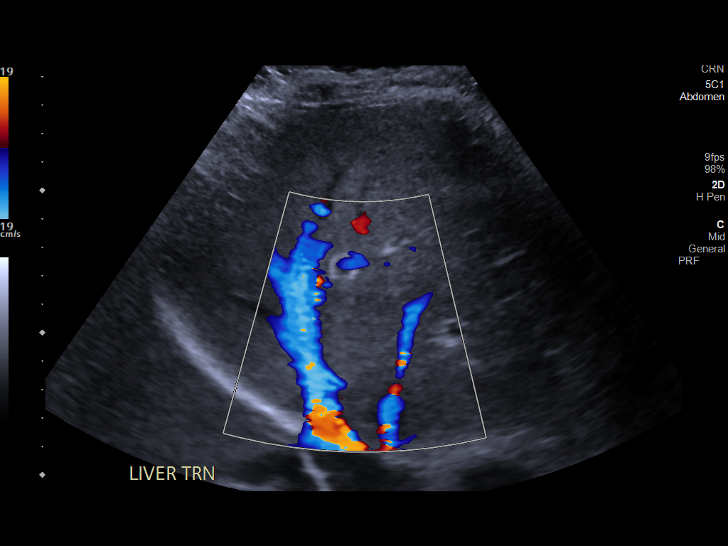
[im 16/42]
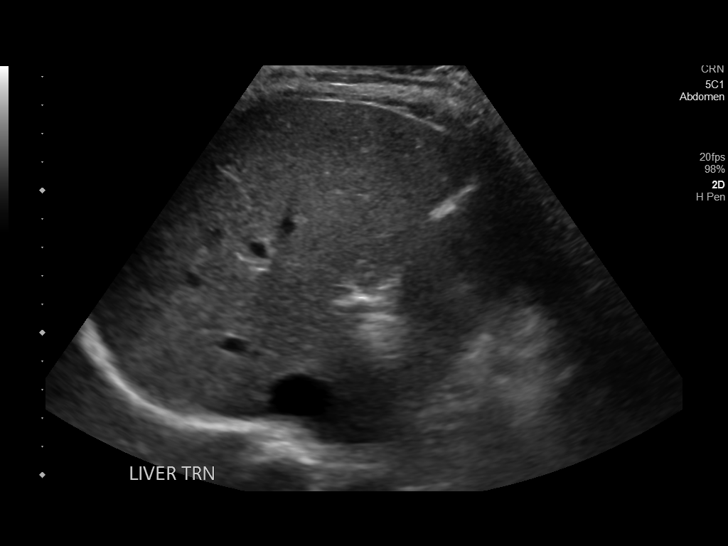
[im 19/42]
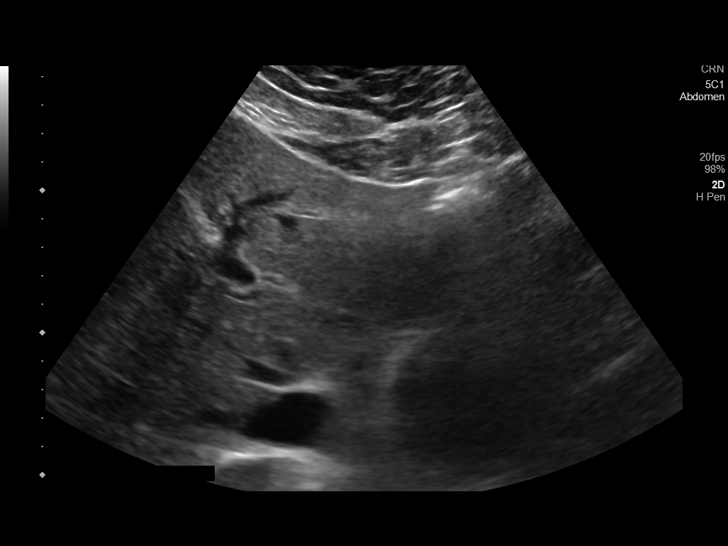
[im 23/42]
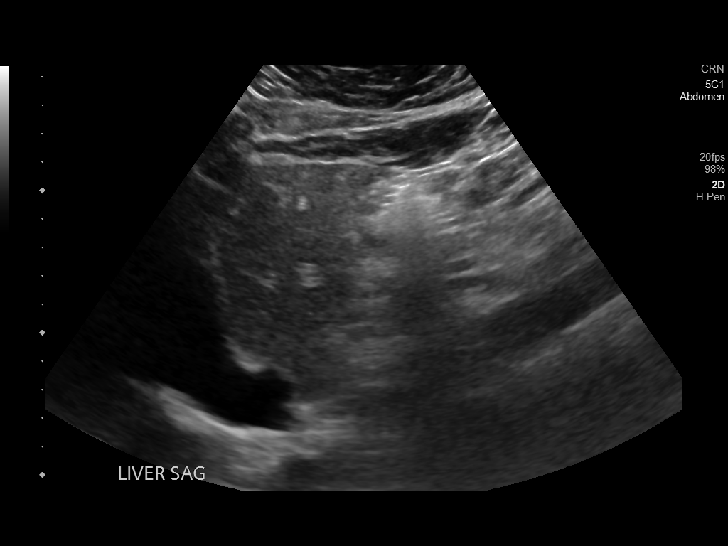
[im 26/42]
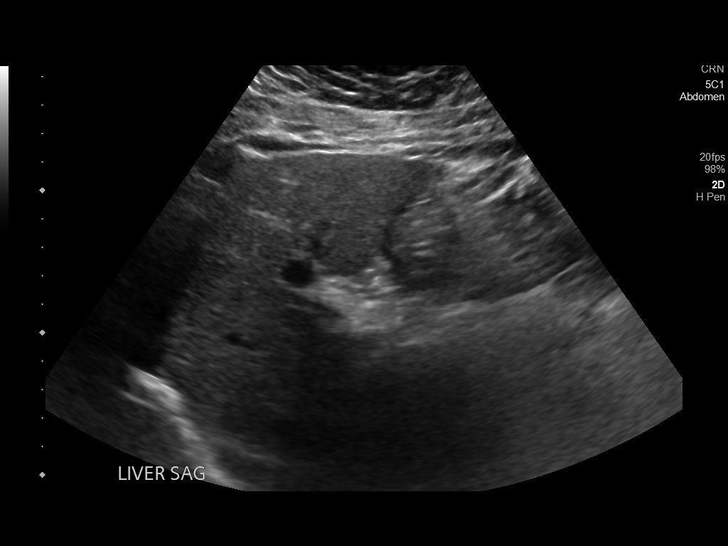
[im 28/42]
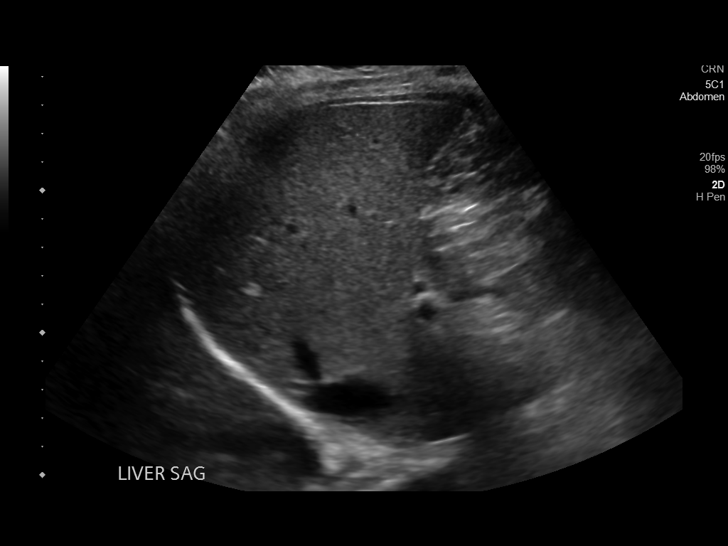
[im 31/42]
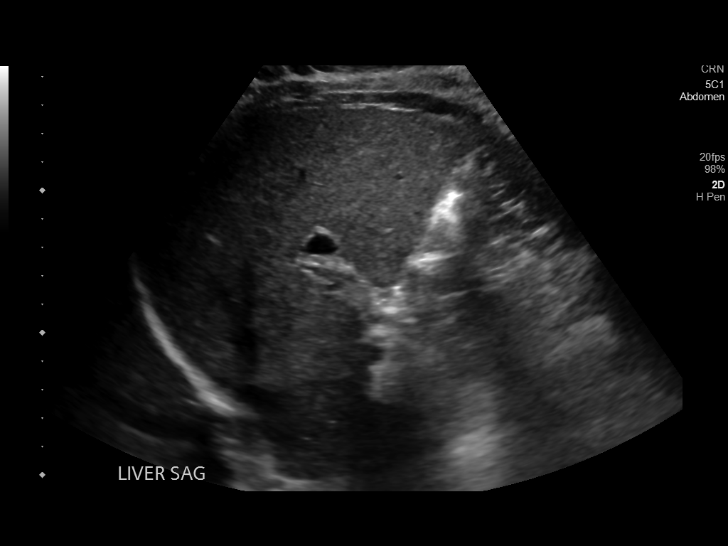
[im 35/42]
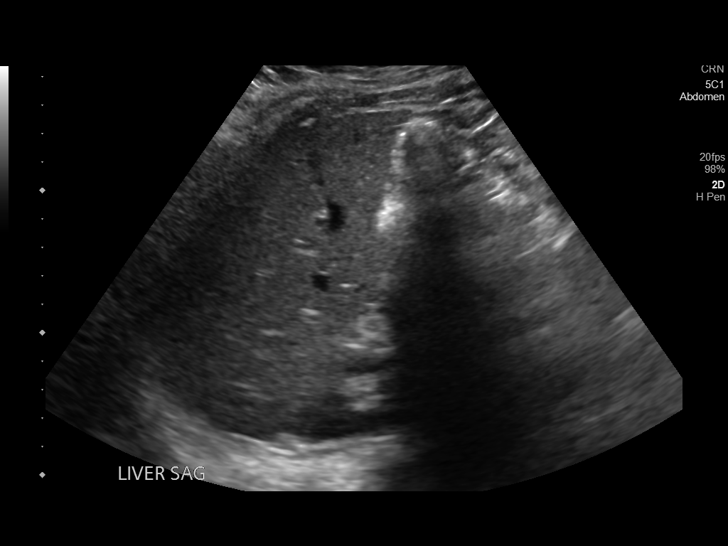
[im 38/42]
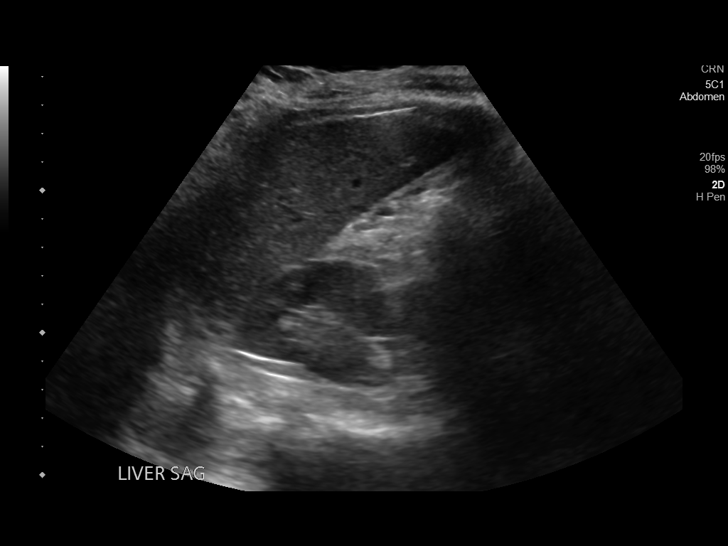
[im 42/42]
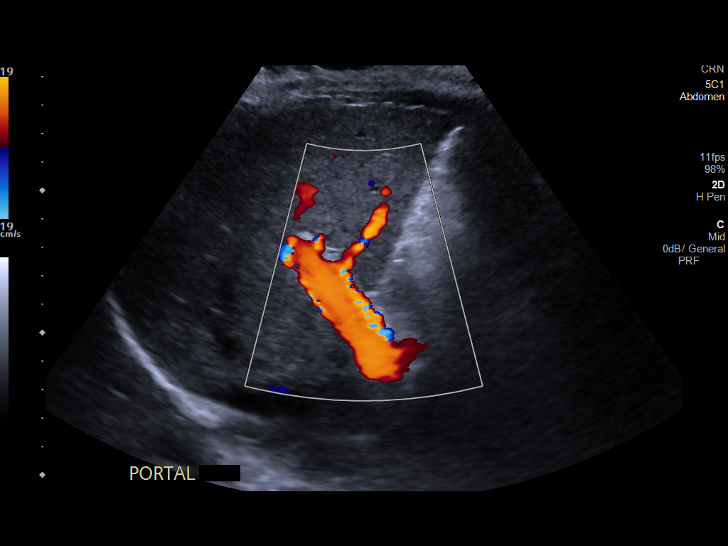

[14 of 25 positions shown; findings below may reference images not displayed]

FINDINGS: Gallbladder:

Surgically absent

Common bile duct:

Diameter: 3 mm diameter, normal

Liver:

Echogenic parenchyma, likely fatty infiltration though this can be
seen with cirrhosis and certain infiltrative disorders. No focal
hepatic mass or nodularity. Portal vein is patent on color Doppler
imaging with normal direction of blood flow towards the liver.

No RIGHT upper quadrant free fluid.
IMPRESSION: Probable fatty infiltration of liver as above.

Post cholecystectomy.

No acute abnormalities.

## 2019-10-31 ENCOUNTER — Ambulatory Visit: Payer: Self-pay | Admitting: Family Medicine

## 2019-11-03 ENCOUNTER — Encounter: Payer: Self-pay | Admitting: Family Medicine

## 2021-12-13 ENCOUNTER — Other Ambulatory Visit (HOSPITAL_BASED_OUTPATIENT_CLINIC_OR_DEPARTMENT_OTHER): Payer: Self-pay

## 2021-12-13 ENCOUNTER — Emergency Department (HOSPITAL_BASED_OUTPATIENT_CLINIC_OR_DEPARTMENT_OTHER): Payer: 59

## 2021-12-13 ENCOUNTER — Emergency Department (HOSPITAL_BASED_OUTPATIENT_CLINIC_OR_DEPARTMENT_OTHER)
Admission: EM | Admit: 2021-12-13 | Discharge: 2021-12-13 | Disposition: A | Discharge status: Home / Self Care | Payer: 59 | Attending: Emergency Medicine | Admitting: Emergency Medicine

## 2021-12-13 ENCOUNTER — Encounter (HOSPITAL_BASED_OUTPATIENT_CLINIC_OR_DEPARTMENT_OTHER): Payer: Self-pay | Admitting: Emergency Medicine

## 2021-12-13 DIAGNOSIS — Z23 Encounter for immunization: Secondary | ICD-10-CM | POA: Diagnosis not present

## 2021-12-13 DIAGNOSIS — B20 Human immunodeficiency virus [HIV] disease: Secondary | ICD-10-CM | POA: Diagnosis not present

## 2021-12-13 DIAGNOSIS — S0990XA Unspecified injury of head, initial encounter: Secondary | ICD-10-CM | POA: Diagnosis present

## 2021-12-13 LAB — CBC
HCT: 46.8 % — ABNORMAL HIGH (ref 36.0–46.0)
Hemoglobin: 15 g/dL (ref 12.0–15.0)
MCH: 27.2 pg (ref 26.0–34.0)
MCHC: 32.1 g/dL (ref 30.0–36.0)
MCV: 84.9 fL (ref 80.0–100.0)
Platelets: 268 10*3/uL (ref 150–400)
RBC: 5.51 MIL/uL — ABNORMAL HIGH (ref 3.87–5.11)
RDW: 12.8 % (ref 11.5–15.5)
WBC: 5.1 10*3/uL (ref 4.0–10.5)
nRBC: 0 % (ref 0.0–0.2)

## 2021-12-13 LAB — BASIC METABOLIC PANEL
Anion gap: 6 (ref 5–15)
BUN: 13 mg/dL (ref 6–20)
CO2: 27 mmol/L (ref 22–32)
Calcium: 9.2 mg/dL (ref 8.9–10.3)
Chloride: 107 mmol/L (ref 98–111)
Creatinine, Ser: 0.79 mg/dL (ref 0.44–1.00)
GFR, Estimated: >60 mL/min (ref >60)
Glucose, Bld: 107 mg/dL — ABNORMAL HIGH (ref 70–99)
Potassium: 4 mmol/L (ref 3.5–5.1)
Sodium: 140 mmol/L (ref 135–145)

## 2021-12-13 LAB — HCG, SERUM, QUALITATIVE: Preg, Serum: NEGATIVE

## 2021-12-13 MED ORDER — ACETAMINOPHEN 325 MG PO TABS
650.0000 mg | ORAL_TABLET | Freq: Four times a day (QID) | ORAL | 0 refills | Status: AC | PRN
  Filled 2021-12-13: qty 100, 13d supply, fill #0

## 2021-12-13 MED ORDER — IOHEXOL 350 MG/ML SOLN
100.0000 mL | Freq: Once | INTRAVENOUS | Status: AC | PRN
  Administered 2021-12-13: 75 mL via INTRAVENOUS

## 2021-12-13 MED ORDER — METHOCARBAMOL 500 MG PO TABS
1000.0000 mg | ORAL_TABLET | Freq: Two times a day (BID) | ORAL | 0 refills | Status: AC
  Filled 2021-12-13: qty 20, 5d supply, fill #0

## 2021-12-13 MED ORDER — HYDROCODONE-ACETAMINOPHEN 5-325 MG PO TABS
1.0000 | ORAL_TABLET | Freq: Once | ORAL | Status: AC
  Administered 2021-12-13: 1 via ORAL
  Filled 2021-12-13: qty 1

## 2021-12-13 MED ORDER — TETANUS-DIPHTH-ACELL PERTUSSIS 5-2.5-18.5 LF-MCG/0.5 IM SUSY
0.5000 mL | PREFILLED_SYRINGE | Freq: Once | INTRAMUSCULAR | Status: AC
Start: 2021-12-13 — End: 2021-12-13
  Administered 2021-12-13: 0.5 mL via INTRAMUSCULAR
  Filled 2021-12-13: qty 0.5
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
## 2021-12-13 NOTE — ED Notes (Signed)
Patient Alert and oriented to baseline. Stable and ambulatory to baseline. Patient verbalized understanding of the discharge instructions.  Patient belongings were taken by the patient.
